# Patient Record
Sex: Female | Born: 1955 | Race: Black or African American | Hispanic: No | Marital: Single | Smoking: Never smoker
Health system: Southern US, Community
[De-identification: ages and names within clinical notes are randomized; demographics above are authoritative.]

## PROBLEM LIST (undated history)

## (undated) DIAGNOSIS — R0789 Other chest pain: Secondary | ICD-10-CM

## (undated) DIAGNOSIS — D649 Anemia, unspecified: Secondary | ICD-10-CM

## (undated) DIAGNOSIS — M1990 Unspecified osteoarthritis, unspecified site: Secondary | ICD-10-CM

## (undated) DIAGNOSIS — J45909 Unspecified asthma, uncomplicated: Secondary | ICD-10-CM

## (undated) DIAGNOSIS — K589 Irritable bowel syndrome without diarrhea: Secondary | ICD-10-CM

## (undated) DIAGNOSIS — K219 Gastro-esophageal reflux disease without esophagitis: Secondary | ICD-10-CM

## (undated) DIAGNOSIS — F419 Anxiety disorder, unspecified: Secondary | ICD-10-CM

## (undated) DIAGNOSIS — E119 Type 2 diabetes mellitus without complications: Secondary | ICD-10-CM

## (undated) DIAGNOSIS — E785 Hyperlipidemia, unspecified: Secondary | ICD-10-CM

## (undated) HISTORY — DX: Gastro-esophageal reflux disease without esophagitis: K21.9

## (undated) HISTORY — DX: Anemia, unspecified: D64.9

## (undated) HISTORY — DX: Other chest pain: R07.89

## (undated) HISTORY — PX: EXPLORATORY LAPAROTOMY: SUR591

## (undated) HISTORY — DX: Anxiety disorder, unspecified: F41.9

## (undated) HISTORY — DX: Unspecified osteoarthritis, unspecified site: M19.90

## (undated) HISTORY — DX: Unspecified asthma, uncomplicated: J45.909

## (undated) HISTORY — PX: PELVIC LAPAROSCOPY: SHX162

## (undated) HISTORY — PX: FINGER SURGERY: SHX640

## (undated) HISTORY — DX: Irritable bowel syndrome, unspecified: K58.9

## (undated) HISTORY — DX: Hyperlipidemia, unspecified: E78.5

---

## 1988-05-29 HISTORY — PX: ROTATOR CUFF REPAIR: SHX139

## 1996-05-29 HISTORY — PX: HYSTEROSCOPY: SHX211

## 1999-09-12 ENCOUNTER — Encounter: Payer: Self-pay | Admitting: Pulmonary Disease

## 1999-09-12 ENCOUNTER — Encounter: Admission: RE | Admit: 1999-09-12 | Discharge: 1999-09-12 | Payer: Self-pay | Admitting: Pulmonary Disease

## 1999-09-23 ENCOUNTER — Ambulatory Visit (HOSPITAL_BASED_OUTPATIENT_CLINIC_OR_DEPARTMENT_OTHER): Admission: RE | Admit: 1999-09-23 | Discharge: 1999-09-23 | Payer: Self-pay | Admitting: Orthopedic Surgery

## 2000-08-13 ENCOUNTER — Other Ambulatory Visit: Admission: RE | Admit: 2000-08-13 | Discharge: 2000-08-13 | Payer: Self-pay | Admitting: *Deleted

## 2000-09-13 ENCOUNTER — Encounter: Admission: RE | Admit: 2000-09-13 | Discharge: 2000-09-13 | Payer: Self-pay | Admitting: Pulmonary Disease

## 2000-09-13 ENCOUNTER — Encounter: Payer: Self-pay | Admitting: Pulmonary Disease

## 2001-10-23 ENCOUNTER — Encounter: Admission: RE | Admit: 2001-10-23 | Discharge: 2001-10-23 | Payer: Self-pay | Admitting: Pulmonary Disease

## 2001-10-23 ENCOUNTER — Encounter: Payer: Self-pay | Admitting: Pulmonary Disease

## 2004-08-15 ENCOUNTER — Ambulatory Visit: Payer: Self-pay | Admitting: Pulmonary Disease

## 2005-05-11 ENCOUNTER — Ambulatory Visit: Payer: Self-pay | Admitting: Pulmonary Disease

## 2005-07-27 ENCOUNTER — Ambulatory Visit: Payer: Self-pay | Admitting: Pulmonary Disease

## 2005-08-07 ENCOUNTER — Ambulatory Visit: Payer: Self-pay | Admitting: Cardiology

## 2005-10-05 ENCOUNTER — Encounter: Admission: RE | Admit: 2005-10-05 | Discharge: 2005-10-05 | Payer: Self-pay | Admitting: Pulmonary Disease

## 2005-12-25 ENCOUNTER — Ambulatory Visit: Payer: Self-pay | Admitting: Pulmonary Disease

## 2006-01-01 ENCOUNTER — Encounter: Admission: RE | Admit: 2006-01-01 | Discharge: 2006-01-01 | Payer: Self-pay | Admitting: Pulmonary Disease

## 2006-01-01 ENCOUNTER — Ambulatory Visit: Payer: Self-pay | Admitting: Pulmonary Disease

## 2006-07-17 ENCOUNTER — Ambulatory Visit: Payer: Self-pay | Admitting: Pulmonary Disease

## 2007-01-07 ENCOUNTER — Ambulatory Visit: Payer: Self-pay | Admitting: Pulmonary Disease

## 2007-01-07 LAB — CONVERTED CEMR LAB
AST: 16 units/L (ref 0–37)
Albumin: 3.9 g/dL (ref 3.5–5.2)
Basophils Absolute: 0 10*3/uL (ref 0.0–0.1)
Basophils Relative: 0.6 % (ref 0.0–1.0)
CO2: 28 meq/L (ref 19–32)
Chloride: 101 meq/L (ref 96–112)
Creatinine, Ser: 0.7 mg/dL (ref 0.4–1.2)
Direct LDL: 94.5 mg/dL
Eosinophils Relative: 1.4 % (ref 0.0–5.0)
Glucose, Bld: 95 mg/dL (ref 70–99)
HCT: 32.8 % — ABNORMAL LOW (ref 36.0–46.0)
Ketones, ur: NEGATIVE mg/dL
Neutrophils Relative %: 52.3 % (ref 43.0–77.0)
Nitrite: NEGATIVE
RBC: 4.09 M/uL (ref 3.87–5.11)
RDW: 13.5 % (ref 11.5–14.6)
Sodium: 136 meq/L (ref 135–145)
Specific Gravity, Urine: 1.025 (ref 1.000–1.03)
Total Bilirubin: 0.7 mg/dL (ref 0.3–1.2)
Total CHOL/HDL Ratio: 2.2
Urobilinogen, UA: 0.2 (ref 0.0–1.0)
VLDL: 7 mg/dL (ref 0–40)
WBC: 3.9 10*3/uL — ABNORMAL LOW (ref 4.5–10.5)
pH: 6 (ref 5.0–8.0)

## 2007-01-17 ENCOUNTER — Ambulatory Visit: Payer: Self-pay | Admitting: Pulmonary Disease

## 2007-01-17 LAB — CONVERTED CEMR LAB: Saturation Ratios: 14.8 % — ABNORMAL LOW (ref 20.0–50.0)

## 2007-02-14 ENCOUNTER — Ambulatory Visit: Payer: Self-pay | Admitting: Pulmonary Disease

## 2007-02-14 LAB — CONVERTED CEMR LAB
Basophils Relative: 0.6 % (ref 0.0–1.0)
CO2: 32 meq/L (ref 19–32)
Creatinine, Ser: 0.8 mg/dL (ref 0.4–1.2)
Eosinophils Relative: 1.1 % (ref 0.0–5.0)
HCT: 36.9 % (ref 36.0–46.0)
Hemoglobin, Urine: NEGATIVE
Hemoglobin: 12.4 g/dL (ref 12.0–15.0)
Leukocytes, UA: NEGATIVE
Monocytes Absolute: 0.3 10*3/uL (ref 0.2–0.7)
Neutrophils Relative %: 61.5 % (ref 43.0–77.0)
Potassium: 4.3 meq/L (ref 3.5–5.1)
RDW: 13.5 % (ref 11.5–14.6)
Sed Rate: 13 mm/hr (ref 0–25)
Sodium: 142 meq/L (ref 135–145)
Specific Gravity, Urine: 1.02 (ref 1.000–1.03)
Total Bilirubin: 0.9 mg/dL (ref 0.3–1.2)
Total Protein, Urine: NEGATIVE mg/dL
Total Protein: 7.2 g/dL (ref 6.0–8.3)
Urobilinogen, UA: 0.2 (ref 0.0–1.0)
pH: 7.5 (ref 5.0–8.0)

## 2007-03-06 ENCOUNTER — Ambulatory Visit: Payer: Self-pay | Admitting: Pulmonary Disease

## 2007-03-06 LAB — CONVERTED CEMR LAB
OCCULT 1: NEGATIVE
OCCULT 5: NEGATIVE

## 2007-06-21 ENCOUNTER — Ambulatory Visit: Payer: Self-pay | Admitting: Pulmonary Disease

## 2007-06-21 DIAGNOSIS — E1169 Type 2 diabetes mellitus with other specified complication: Secondary | ICD-10-CM | POA: Insufficient documentation

## 2007-06-21 DIAGNOSIS — E7800 Pure hypercholesterolemia, unspecified: Secondary | ICD-10-CM | POA: Insufficient documentation

## 2007-06-21 DIAGNOSIS — F411 Generalized anxiety disorder: Secondary | ICD-10-CM | POA: Insufficient documentation

## 2007-06-21 DIAGNOSIS — K589 Irritable bowel syndrome without diarrhea: Secondary | ICD-10-CM | POA: Insufficient documentation

## 2007-06-21 DIAGNOSIS — Z8719 Personal history of other diseases of the digestive system: Secondary | ICD-10-CM | POA: Insufficient documentation

## 2007-06-21 DIAGNOSIS — J309 Allergic rhinitis, unspecified: Secondary | ICD-10-CM | POA: Insufficient documentation

## 2007-06-29 ENCOUNTER — Encounter: Payer: Self-pay | Admitting: Pulmonary Disease

## 2007-12-03 ENCOUNTER — Telehealth (INDEPENDENT_AMBULATORY_CARE_PROVIDER_SITE_OTHER): Payer: Self-pay | Admitting: *Deleted

## 2008-01-02 ENCOUNTER — Ambulatory Visit: Payer: Self-pay | Admitting: Pulmonary Disease

## 2008-01-02 DIAGNOSIS — R0789 Other chest pain: Secondary | ICD-10-CM | POA: Insufficient documentation

## 2008-01-04 DIAGNOSIS — M1990 Unspecified osteoarthritis, unspecified site: Secondary | ICD-10-CM | POA: Insufficient documentation

## 2008-01-04 DIAGNOSIS — K219 Gastro-esophageal reflux disease without esophagitis: Secondary | ICD-10-CM | POA: Insufficient documentation

## 2008-01-20 ENCOUNTER — Telehealth (INDEPENDENT_AMBULATORY_CARE_PROVIDER_SITE_OTHER): Payer: Self-pay | Admitting: *Deleted

## 2008-07-29 ENCOUNTER — Ambulatory Visit: Payer: Self-pay | Admitting: Pulmonary Disease

## 2009-07-19 ENCOUNTER — Ambulatory Visit: Payer: Self-pay | Admitting: Pulmonary Disease

## 2009-07-20 DIAGNOSIS — J209 Acute bronchitis, unspecified: Secondary | ICD-10-CM | POA: Insufficient documentation

## 2009-09-07 ENCOUNTER — Ambulatory Visit: Payer: Self-pay | Admitting: Pulmonary Disease

## 2009-09-09 LAB — CONVERTED CEMR LAB
ALT: 12 units/L (ref 0–35)
Alkaline Phosphatase: 59 units/L (ref 39–117)
Basophils Absolute: 0 10*3/uL (ref 0.0–0.1)
Basophils Relative: 0.7 % (ref 0.0–3.0)
Bilirubin, Direct: 0.1 mg/dL (ref 0.0–0.3)
CO2: 29 meq/L (ref 19–32)
Calcium: 9.6 mg/dL (ref 8.4–10.5)
Chloride: 109 meq/L (ref 96–112)
Creatinine, Ser: 0.7 mg/dL (ref 0.4–1.2)
Direct LDL: 141.2 mg/dL
Eosinophils Absolute: 0.1 10*3/uL (ref 0.0–0.7)
Hemoglobin: 12.5 g/dL (ref 12.0–15.0)
Iron: 56 ug/dL (ref 42–145)
MCHC: 34.2 g/dL (ref 30.0–36.0)
MCV: 81.9 fL (ref 78.0–100.0)
Monocytes Absolute: 0.2 10*3/uL (ref 0.1–1.0)
Neutro Abs: 2.3 10*3/uL (ref 1.4–7.7)
RBC: 4.46 M/uL (ref 3.87–5.11)
RDW: 14.9 % — ABNORMAL HIGH (ref 11.5–14.6)
Saturation Ratios: 16.9 % — ABNORMAL LOW (ref 20.0–50.0)
Sodium: 148 meq/L — ABNORMAL HIGH (ref 135–145)
TSH: 0.81 microintl units/mL (ref 0.35–5.50)
Total Bilirubin: 0.5 mg/dL (ref 0.3–1.2)
Total Protein: 6.8 g/dL (ref 6.0–8.3)
Transferrin: 236.6 mg/dL (ref 212.0–360.0)

## 2009-09-14 LAB — CONVERTED CEMR LAB: Vit D, 25-Hydroxy: 16 ng/mL — ABNORMAL LOW (ref 30–89)

## 2010-06-19 ENCOUNTER — Encounter: Payer: Self-pay | Admitting: Pulmonary Disease

## 2010-06-20 ENCOUNTER — Encounter (INDEPENDENT_AMBULATORY_CARE_PROVIDER_SITE_OTHER): Payer: Self-pay | Admitting: *Deleted

## 2010-06-26 LAB — CONVERTED CEMR LAB
Alkaline Phosphatase: 73 units/L (ref 39–117)
Bilirubin, Direct: 0.1 mg/dL (ref 0.0–0.3)
Calcium: 9.6 mg/dL (ref 8.4–10.5)
Cholesterol: 205 mg/dL (ref 0–200)
Crystals: NEGATIVE
Eosinophils Absolute: 0.1 10*3/uL (ref 0.0–0.7)
Eosinophils Relative: 1.5 % (ref 0.0–5.0)
GFR calc Af Amer: 98 mL/min
GFR calc non Af Amer: 81 mL/min
HDL: 88.1 mg/dL (ref >39.0)
Hemoglobin, Urine: NEGATIVE
Leukocytes, UA: NEGATIVE
MCV: 81.5 fL (ref 78.0–100.0)
Neutrophils Relative %: 50.4 % (ref 43.0–77.0)
Nitrite: NEGATIVE
Platelets: 244 10*3/uL (ref 150–400)
Potassium: 4.2 meq/L (ref 3.5–5.1)
RBC / HPF: NONE SEEN
RDW: 13.9 % (ref 11.5–14.6)
Sodium: 142 meq/L (ref 135–145)
Specific Gravity, Urine: 1.015 (ref 1.000–1.03)
TSH: 0.89 microintl units/mL (ref 0.35–5.50)
Total CHOL/HDL Ratio: 2.3
Total Protein: 7.2 g/dL (ref 6.0–8.3)
Triglycerides: 37 mg/dL (ref 0–149)
Urine Glucose: NEGATIVE mg/dL
Urobilinogen, UA: 0.2 (ref 0.0–1.0)
WBC: 4.8 10*3/uL (ref 4.5–10.5)
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
## 2010-06-30 NOTE — Letter (Signed)
Summary: Pre Visit Letter Revised  [facility]  [address] [place], [place] [address]   Phone: [phone]  Fax: [phone]        06/20/2010 MRN: [id number] Yvonne Marshall [address] [place], [place]  [address]             Procedure Date:  07/19/2011 @ 1:30   Direct colon-Dr. Russella Dar   Welcome to the Gastroenterology Division at [facility].    You are scheduled to see a nurse for your pre-procedure visit on 07/04/2010 at 4:30 on the 3rd floor at [facility], [address]  We ask that you try to arrive at our office 15 minutes prior to your appointment time to allow for check-in.  Please take a minute to review the attached form.  If you answer "Yes" to one or more of the questions on the first page, we ask that you call the person listed at your earliest opportunity.  If you answer "No" to all of the questions, please complete the rest of the form and bring it to your appointment.    Your nurse visit will consist of discussing your medical and surgical history, your immediate family medical history, and your medications.   If you are unable to list all of your medications on the form, please bring the medication bottles to your appointment and we will list them.  We will need to be aware of both prescribed and over the counter drugs.  We will need to know exact dosage information as well.    Please be prepared to read and sign documents such as consent forms, a financial agreement, and acknowledgement forms.  If necessary, and with your consent, a friend or relative is welcome to sit-in on the nurse visit with you.  Please bring your insurance card so that we may make a copy of it.  If your insurance requires a referral to see a specialist, please bring your referral form from your primary care physician.  No co-pay is required for this nurse visit.     If you cannot keep your appointment, please call [phone] to cancel or reschedule prior to your  appointment date.  This allows [place] the opportunity to schedule an appointment for another patient in need of care.    Thank you for choosing [facility] for your medical needs.  We appreciate the opportunity to care for you.  Please visit [place] at our website  to learn more about our practice.  Sincerely, The Gastroenterology Division

## 2010-07-18 ENCOUNTER — Other Ambulatory Visit: Payer: Self-pay | Admitting: Gastroenterology

## 2010-07-20 ENCOUNTER — Other Ambulatory Visit: Payer: Self-pay | Admitting: Gastroenterology

## 2010-07-20 ENCOUNTER — Telehealth: Payer: Self-pay | Admitting: Pulmonary Disease

## 2010-07-21 ENCOUNTER — Other Ambulatory Visit: Payer: Self-pay | Admitting: Pulmonary Disease

## 2010-07-21 DIAGNOSIS — Z1231 Encounter for screening mammogram for malignant neoplasm of breast: Secondary | ICD-10-CM

## 2010-07-26 ENCOUNTER — Encounter (INDEPENDENT_AMBULATORY_CARE_PROVIDER_SITE_OTHER): Payer: Self-pay | Admitting: *Deleted

## 2010-07-26 NOTE — Progress Notes (Signed)
Summary: needs referral for mammogram  Phone Note Call from Patient Call back at Home Phone [phone]   Caller: Patient Call For: Yvonne Marshall Summary of Call: Patient has her physical on 4/16 but she needs a referral for her mammogram before that appointment and she wants to know who Dr Kriste Basque would referr her to. she can be reached at [phone] Initial call taken by: Vedia Coffer,  July 20, 2010 4:55 PM  Follow-up for Phone Call        Called and spoke with pt.  She states that she is overdue for mammogram and needs to have this sched asap.  She also requested 90 day supply of lipitor be sent to [facility] in [place] and this was done. Pls advise, thanks Follow-up by: Vernie Murders,  July 20, 2010 5:03 PM  Additional Follow-up for Phone Call Additional follow up Details #1::        per SN-----recs for [facility]--[address] of [address]  order has been placed for pt ...called and spoke with pt and she is aware Randell Loop CMA  July 21, 2010 8:50 AM     Prescriptions: LIPITOR 20 MG TABS (ATORVASTATIN CALCIUM) take one tablet by mouth once daily  #90 x 0   Entered by:   Vernie Murders   Authorized by:   Michele Mcalpine MD   Signed by:   Vernie Murders on 07/20/2010   Method used:   Electronically to        [facility] [address]* (retail)       [address] [place], [place]  [address]       Ph: [phone]       Fax: [phone]   RxID:   [id number]

## 2010-07-29 ENCOUNTER — Encounter: Payer: Self-pay | Admitting: *Deleted

## 2010-07-29 ENCOUNTER — Ambulatory Visit
Admission: RE | Admit: 2010-07-29 | Discharge: 2010-07-29 | Disposition: A | Discharge status: Home / Self Care | Payer: Commercial Indemnity | Source: Ambulatory Visit | Attending: Pulmonary Disease | Admitting: Pulmonary Disease

## 2010-07-29 DIAGNOSIS — D649 Anemia, unspecified: Secondary | ICD-10-CM | POA: Insufficient documentation

## 2010-07-29 DIAGNOSIS — Z1231 Encounter for screening mammogram for malignant neoplasm of breast: Secondary | ICD-10-CM

## 2010-08-02 ENCOUNTER — Other Ambulatory Visit: Payer: Self-pay | Admitting: Pulmonary Disease

## 2010-08-02 DIAGNOSIS — R928 Other abnormal and inconclusive findings on diagnostic imaging of breast: Secondary | ICD-10-CM

## 2010-08-04 NOTE — Letter (Signed)
Summary: Pre Visit Letter Revised  [facility]  [address] [place], [place] [address]   Phone: [phone]  Fax: [phone]        07/26/2010 MRN: [id number] Yvonne Marshall [address] [place], [place]  [address]             Procedure Date:  08/16/2010 @ 10:30   Direct colon-Dr. Russella Dar   Welcome to the Gastroenterology Division at [facility].    You are scheduled to see a nurse for your pre-procedure visit on 08/02/2010 at 11:00 on the 3rd floor at [facility], [address]  We ask that you try to arrive at our office 15 minutes prior to your appointment time to allow for check-in.  Please take a minute to review the attached form.  If you answer "Yes" to one or more of the questions on the first page, we ask that you call the person listed at your earliest opportunity.  If you answer "No" to all of the questions, please complete the rest of the form and bring it to your appointment.    Your nurse visit will consist of discussing your medical and surgical history, your immediate family medical history, and your medications.   If you are unable to list all of your medications on the form, please bring the medication bottles to your appointment and we will list them.  We will need to be aware of both prescribed and over the counter drugs.  We will need to know exact dosage information as well.    Please be prepared to read and sign documents such as consent forms, a financial agreement, and acknowledgement forms.  If necessary, and with your consent, a friend or relative is welcome to sit-in on the nurse visit with you.  Please bring your insurance card so that we may make a copy of it.  If your insurance requires a referral to see a specialist, please bring your referral form from your primary care physician.  No co-pay is required for this nurse visit.     If you cannot keep your appointment, please call [phone] to cancel or reschedule prior to your  appointment date.  This allows [place] the opportunity to schedule an appointment for another patient in need of care.    Thank you for choosing [facility] for your medical needs.  We appreciate the opportunity to care for you.  Please visit [place] at our website  to learn more about our practice.  Sincerely, The Gastroenterology Division

## 2010-08-05 ENCOUNTER — Ambulatory Visit
Admission: RE | Admit: 2010-08-05 | Discharge: 2010-08-05 | Disposition: A | Discharge status: Home / Self Care | Payer: Commercial Indemnity | Source: Ambulatory Visit | Attending: Pulmonary Disease | Admitting: Pulmonary Disease

## 2010-08-05 DIAGNOSIS — R928 Other abnormal and inconclusive findings on diagnostic imaging of breast: Secondary | ICD-10-CM

## 2010-08-16 ENCOUNTER — Other Ambulatory Visit: Payer: Self-pay | Admitting: Gastroenterology

## 2010-09-09 ENCOUNTER — Encounter: Payer: Self-pay | Admitting: Pulmonary Disease

## 2010-09-12 ENCOUNTER — Encounter: Payer: Self-pay | Admitting: Pulmonary Disease

## 2010-10-11 NOTE — Assessment & Plan Note (Signed)
[facility]                             PULMONARY OFFICE NOTE   NAME:HOLTKarianna, Gusman                        MRN:          [id number]  DATE:02/14/2007                            DOB:          [birth year]    HISTORY OF PRESENT ILLNESS:  The patient is a 55 year old African-  American female patient of Dr. Jodelle Green who has a known history of  allergic rhinitis, asthmatic bronchitis, and hyperlipidemia. She  presents today for a one week history of lower abdominal pain. The  patient complains of some lower abdominal cramping and discomfort over  the last week. She does complain of associated constipation, hard  stools, increased gas and some mild dysuria that began this morning.  Symptoms began shortly after starting iron therapy for iron deficiency  anemia. The patient denies any fever, nausea, vomiting, diarrhea, bloody  stools, recent travel, antibiotic use, chest pain, palpitations, reflux,  or shortness of breath. The patient is menopausal and has not had  periods for greater than six years. The patient recently was noted to  have some mild anemia with a hemoglobin at 11.2. Iron level was at 51  with an iron saturation at 14.8. The patient started on Niferex 150 mg  daily and reports the symptoms started shortly after this. The patient  has not returned her stool card as of yet. She has no family history of  colon cancer. Symptoms seem to be worse after eating and with walking or  activity.   PAST MEDICAL HISTORY:  Reviewed.   CURRENT MEDICATIONS:  Reviewed.   PHYSICAL EXAMINATION:  GENERAL:  The patient is a pleasant female in no  acute distress.  VITAL SIGNS:  She is afebrile. Blood pressure 108/64. Pulse is 58. O2  saturation is 100% on room air. Weight is at 163.  HEENT:  Sclerae nonicteric. Posterior pharynx is clear. Oral mucosa is  pink and moist.  NECK:  Supple with cervical adenopathy. No JVD.  LUNGS:  Clear to auscultation bilaterally.  CARDIAC:  S1, S2 with no murmurs, rubs, or gallops.  ABDOMEN:  Soft and nontender. No palpable hepatosplenomegaly. No  guarding or rebounding noted. The patient does have some generalized  tenderness that is greater on the left upper quadrant. No bruits or  masses appreciated.  BACK:  Negative CVA tenderness.  EXTREMITIES:  Warm without calf tenderness, cyanosis, clubbing, or  edema.  RECTAL:  Revealed a normal sphincter tone. No notable stool in the vault  and negative occult stool.   DATA:  White blood cell count 4700, hemoglobin 12.4, potassium 4.3,  blood sugar 101, BUN and creatinine 8 and 0.8 respectively. Liver  function tests were normal. Sedimentation rate was normal at 13 and  urinalysis is negative.   IMPRESSION AND PLAN:  Lower abdominal pain and cramping questionable  etiology. The patient may have a component of mild gastritis secondary  to iron therapy versus a component of irritable bowel syndrome with  associated constipation. I have recommended that the patient hold her  iron therapy at this time. She is to begin Protonix 40 mg daily.  A bland  diet and advance was recommended as tolerated. She may use Levsin 0.125  mg with a number of 30 tablets given with one refill to be used as  needed for abdominal cramping. She is advised to add in Metamucil or  Citrucel to her daily regimen. She may also use Senokot stool softener.  She also has been recommended to use Gas-X. She has also been  recommended to return her stool cards as soon as possible. The patient  will return back here in 1 to 2 weeks for follow up. If the symptoms do  not improve or worsen, the patient is to contact us for sooner follow up  or is to seek emergency room attention.      Rubye Oaks, NP  Electronically Signed      Lonzo Cloud. Kriste Basque, MD  Electronically Signed   TP/MedQ  DD: 02/14/2007  DT: 02/15/2007  Job #: [id number]
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
## 2011-07-10 ENCOUNTER — Other Ambulatory Visit: Payer: Self-pay | Admitting: Pulmonary Disease

## 2011-07-10 DIAGNOSIS — R920 Mammographic microcalcification found on diagnostic imaging of breast: Secondary | ICD-10-CM

## 2011-07-31 ENCOUNTER — Telehealth: Payer: Self-pay | Admitting: Pulmonary Disease

## 2011-08-01 ENCOUNTER — Ambulatory Visit
Admission: RE | Admit: 2011-08-01 | Discharge: 2011-08-01 | Disposition: A | Discharge status: Home / Self Care | Payer: BC Managed Care – PPO | Source: Ambulatory Visit | Attending: Pulmonary Disease | Admitting: Pulmonary Disease

## 2011-08-01 ENCOUNTER — Ambulatory Visit: Payer: Commercial Indemnity | Admitting: Pulmonary Disease

## 2011-08-01 DIAGNOSIS — R920 Mammographic microcalcification found on diagnostic imaging of breast: Secondary | ICD-10-CM

## 2011-08-01 MED ORDER — ATORVASTATIN CALCIUM 20 MG PO TABS: 20.0000 mg | ORAL_TABLET | Freq: Every day | ORAL | Status: DC

## 2011-08-01 NOTE — Telephone Encounter (Signed)
PT informed that refill was sent to pharmacy for Lipitor.  Pt will call office to schedule CPX with Dr Kriste Basque.

## 2011-08-01 NOTE — Telephone Encounter (Signed)
Pt last seen 2011- needs appt for refills. LMTCB.

## 2011-11-14 ENCOUNTER — Ambulatory Visit: Payer: BC Managed Care – PPO | Admitting: Pulmonary Disease

## 2011-12-15 ENCOUNTER — Ambulatory Visit: Payer: BC Managed Care – PPO | Admitting: Pulmonary Disease

## 2012-08-21 ENCOUNTER — Ambulatory Visit: Payer: BC Managed Care – PPO | Admitting: Pulmonary Disease

## 2012-08-26 ENCOUNTER — Other Ambulatory Visit: Payer: Self-pay

## 2012-08-26 DIAGNOSIS — Z1231 Encounter for screening mammogram for malignant neoplasm of breast: Secondary | ICD-10-CM

## 2012-09-02 ENCOUNTER — Other Ambulatory Visit (INDEPENDENT_AMBULATORY_CARE_PROVIDER_SITE_OTHER): Payer: 59

## 2012-09-02 ENCOUNTER — Ambulatory Visit (INDEPENDENT_AMBULATORY_CARE_PROVIDER_SITE_OTHER)
Admission: RE | Admit: 2012-09-02 | Discharge: 2012-09-02 | Disposition: A | Discharge status: Home / Self Care | Payer: 59 | Source: Ambulatory Visit | Attending: Pulmonary Disease | Admitting: Pulmonary Disease

## 2012-09-02 ENCOUNTER — Ambulatory Visit (INDEPENDENT_AMBULATORY_CARE_PROVIDER_SITE_OTHER): Payer: 59 | Admitting: Pulmonary Disease

## 2012-09-02 ENCOUNTER — Ambulatory Visit
Admission: RE | Admit: 2012-09-02 | Discharge: 2012-09-02 | Disposition: A | Discharge status: Home / Self Care | Payer: 59 | Source: Ambulatory Visit

## 2012-09-02 ENCOUNTER — Encounter: Payer: Self-pay | Admitting: Pulmonary Disease

## 2012-09-02 VITALS — BP 110/70 | HR 55 | Temp 98.5°F | Ht 66.0 in | Wt 142.6 lb

## 2012-09-02 DIAGNOSIS — Z1231 Encounter for screening mammogram for malignant neoplasm of breast: Secondary | ICD-10-CM

## 2012-09-02 DIAGNOSIS — J309 Allergic rhinitis, unspecified: Secondary | ICD-10-CM

## 2012-09-02 DIAGNOSIS — M1990 Unspecified osteoarthritis, unspecified site: Secondary | ICD-10-CM

## 2012-09-02 DIAGNOSIS — Z0000 Encounter for general adult medical examination without abnormal findings: Secondary | ICD-10-CM

## 2012-09-02 DIAGNOSIS — K219 Gastro-esophageal reflux disease without esophagitis: Secondary | ICD-10-CM

## 2012-09-02 DIAGNOSIS — R0789 Other chest pain: Secondary | ICD-10-CM

## 2012-09-02 DIAGNOSIS — E785 Hyperlipidemia, unspecified: Secondary | ICD-10-CM

## 2012-09-02 DIAGNOSIS — K589 Irritable bowel syndrome without diarrhea: Secondary | ICD-10-CM

## 2012-09-02 DIAGNOSIS — F411 Generalized anxiety disorder: Secondary | ICD-10-CM

## 2012-09-02 DIAGNOSIS — D649 Anemia, unspecified: Secondary | ICD-10-CM

## 2012-09-02 DIAGNOSIS — J45909 Unspecified asthma, uncomplicated: Secondary | ICD-10-CM

## 2012-09-02 LAB — BASIC METABOLIC PANEL
BUN: 12 mg/dL (ref 6–23)
Calcium: 9.4 mg/dL (ref 8.4–10.5)
GFR: 115.78 mL/min (ref >60.00)
Potassium: 4.1 mEq/L (ref 3.5–5.1)

## 2012-09-02 LAB — HEPATIC FUNCTION PANEL
ALT: 10 U/L (ref 0–35)
AST: 17 U/L (ref 0–37)
Bilirubin, Direct: 0.1 mg/dL (ref 0.0–0.3)
Total Protein: 7.1 g/dL (ref 6.0–8.3)

## 2012-09-02 LAB — CBC WITH DIFFERENTIAL/PLATELET
Basophils Absolute: 0 10*3/uL (ref 0.0–0.1)
Eosinophils Relative: 1.5 % (ref 0.0–5.0)
HCT: 39 % (ref 36.0–46.0)
Lymphocytes Relative: 24.3 % (ref 12.0–46.0)
Lymphs Abs: 1.1 10*3/uL (ref 0.7–4.0)
Monocytes Relative: 5.1 % (ref 3.0–12.0)
Neutrophils Relative %: 68.7 % (ref 43.0–77.0)
Platelets: 210 10*3/uL (ref 150.0–400.0)
WBC: 4.4 10*3/uL — ABNORMAL LOW (ref 4.5–10.5)

## 2012-09-02 LAB — LIPID PANEL
HDL: 114.1 mg/dL (ref >39.00)
Triglycerides: 37 mg/dL (ref 0.0–149.0)

## 2012-09-02 LAB — LDL CHOLESTEROL, DIRECT: Direct LDL: 134.8 mg/dL

## 2012-09-02 LAB — TSH: TSH: 0.97 u[IU]/mL (ref 0.35–5.50)

## 2012-09-02 MED ORDER — FLUTICASONE PROPIONATE 50 MCG/ACT NA SUSP: 2.0000 | Freq: Every day | NASAL | Status: DC

## 2012-09-02 MED ORDER — FLUTICASONE-SALMETEROL 250-50 MCG/DOSE IN AEPB: 1.0000 | INHALATION_SPRAY | Freq: Two times a day (BID) | RESPIRATORY_TRACT | Status: DC

## 2012-09-02 MED ORDER — FEXOFENADINE HCL 180 MG PO TABS: 180.0000 mg | ORAL_TABLET | Freq: Every day | ORAL | Status: DC

## 2012-09-02 MED ORDER — OMEPRAZOLE 20 MG PO CPDR: 20.0000 mg | DELAYED_RELEASE_CAPSULE | Freq: Every day | ORAL | Status: DC

## 2012-09-02 NOTE — Patient Instructions (Addendum)
Today we updated your med list in our EPIC system...    Continue your current medications the same...    We refilled your meds per request...  Today we did your follow up CXR, EKG, & FASTING blood work...    We will contact you w/ the results when available...   Keep up the good work w/ your diet & exercise program...  Call for any questions...  Let's plan a follow up visit in 13yr, sooner if needed for problems.Marland KitchenMarland Kitchen
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
## 2012-09-03 ENCOUNTER — Encounter: Payer: Self-pay | Admitting: Gastroenterology

## 2012-09-03 ENCOUNTER — Other Ambulatory Visit: Payer: Self-pay | Admitting: Pulmonary Disease

## 2012-09-03 LAB — VITAMIN D 25 HYDROXY (VIT D DEFICIENCY, FRACTURES): Vit D, 25-Hydroxy: 10 ng/mL — ABNORMAL LOW (ref 30–89)

## 2012-09-03 MED ORDER — ATORVASTATIN CALCIUM 10 MG PO TABS: 10.0000 mg | ORAL_TABLET | Freq: Every day | ORAL | Status: DC

## 2012-09-03 MED ORDER — VITAMIN D (ERGOCALCIFEROL) 1.25 MG (50000 UNIT) PO CAPS: 50000.0000 [IU] | ORAL_CAPSULE | ORAL | Status: DC

## 2012-10-11 ENCOUNTER — Telehealth: Payer: Self-pay | Admitting: *Deleted

## 2012-10-11 ENCOUNTER — Encounter: Payer: Self-pay | Admitting: Gastroenterology

## 2012-10-11 NOTE — Telephone Encounter (Signed)
Spoke with Amy, letter sent

## 2012-10-11 NOTE — Telephone Encounter (Signed)
Pt scheduled with Dr. Rhea Belton and was pt of Dr Ardell Isaacs.  RSC colon for 12/16/12 at 11:30am on a Monday/ Pt. Request.  I [facility] her previsit for 12/02/12 at 11:00am.  Message of new appts. Left on pt home phone / pt. Request.  Unable to leave voice message mailbox was full.

## 2012-10-25 ENCOUNTER — Encounter: Payer: 59 | Admitting: Gastroenterology

## 2012-11-04 ENCOUNTER — Encounter: Payer: 59 | Admitting: Internal Medicine

## 2012-12-02 ENCOUNTER — Ambulatory Visit (AMBULATORY_SURGERY_CENTER): Payer: 59 | Admitting: *Deleted

## 2012-12-02 VITALS — Ht 66.0 in | Wt 144.2 lb

## 2012-12-02 DIAGNOSIS — Z1211 Encounter for screening for malignant neoplasm of colon: Secondary | ICD-10-CM

## 2012-12-02 MED ORDER — MOVIPREP 100 G PO SOLR: 1.0000 | Freq: Once | ORAL | Status: DC

## 2012-12-02 NOTE — Progress Notes (Signed)
Denies allergies to eggs or soy products. Denies complications with sedation or anesthesia. 

## 2012-12-05 ENCOUNTER — Encounter: Payer: Self-pay | Admitting: Gastroenterology

## 2012-12-16 ENCOUNTER — Encounter: Payer: Self-pay | Admitting: Gastroenterology

## 2012-12-16 ENCOUNTER — Ambulatory Visit (AMBULATORY_SURGERY_CENTER): Payer: 59 | Admitting: Gastroenterology

## 2012-12-16 VITALS — BP 125/66 | HR 49 | Temp 97.6°F | Resp 21 | Ht 66.0 in | Wt 144.0 lb

## 2012-12-16 DIAGNOSIS — D126 Benign neoplasm of colon, unspecified: Secondary | ICD-10-CM

## 2012-12-16 DIAGNOSIS — Z1211 Encounter for screening for malignant neoplasm of colon: Secondary | ICD-10-CM

## 2012-12-16 MED ORDER — SODIUM CHLORIDE 0.9 % IV SOLN: 500.0000 mL | INTRAVENOUS | Status: DC

## 2012-12-16 NOTE — Progress Notes (Signed)
Called to room to assist during endoscopic procedure.  Patient ID and intended procedure confirmed with present staff. Received instructions for my participation in the procedure from the performing physician.  

## 2012-12-16 NOTE — Op Note (Signed)
[facility] [address] [place], [address]   COLONOSCOPY PROCEDURE REPORT PATIENT: Yvonne Marshall, Yvonne Marshall  MR#: [id number] BIRTHDATE: May 22, 1956 , 56  yrs. old GENDER: Female ENDOSCOPIST: Meryl Dare, MD, [facility] PROCEDURE DATE:  12/16/2012 PROCEDURE:   Colonoscopy with biopsy First Screening Colonoscopy - Avg.  risk and is 50 yrs.  old or older Yes.  Prior Negative Screening - Now for repeat screening. N/A  History of Adenoma - Now for follow-up colonoscopy & has been > or = to 3 yrs.  N/A  Polyps Removed Today? Yes. ASA CLASS:   Class II INDICATIONS:average risk screening. MEDICATIONS: MAC sedation, administered by CRNA and propofol (Diprivan) 350mg  IV DESCRIPTION OF PROCEDURE:   After the risks benefits and alternatives of the procedure were thoroughly explained, informed consent was obtained.  A digital rectal exam revealed no abnormalities of the rectum.   The [id number]  endoscope was introduced through the anus and advanced to the cecum, which was identified by both the appendix and ileocecal valve. No adverse events experienced.   The quality of the prep was excellent, using MoviPrep  The instrument was then slowly withdrawn as the colon was fully examined.  COLON FINDINGS: A sessile polyp measuring 3 mm in size was found at the cecum.  A polypectomy was performed with cold forceps.  The resection was complete and the polyp tissue was completely retrieved.   A sessile polyp measuring 3 mm in size was found in the sigmoid colon.  A polypectomy was performed with cold forceps. The resection was complete and the polyp tissue was completely retrieved.   Mild diverticulosis was noted in the sigmoid colon. The colon was otherwise normal.  There was no diverticulosis, inflammation, polyps or cancers unless previously stated. Retroflexed views revealed moderate internal hemorrhoids. The time to cecum=3 minutes 27 seconds.  Withdrawal time=12 minutes  20 seconds.  The scope was withdrawn and the procedure completed. COMPLICATIONS: There were no complications.  ENDOSCOPIC IMPRESSION: 1.   Sessile polyp measuring 3 mm at the cecum; polypectomy performed with cold forceps 2.   Sessile polyp measuring 3 mm in the sigmoid colon; polypectomy performed with cold forceps 3.   Mild diverticulosis was noted in the sigmoid colon 4.   Moderate internal hemorrhoids  RECOMMENDATIONS: 1.  Await pathology results 2.  Repeat colonoscopy in 5 years if polyp(s) adenomatous; otherwise 10 years 3.  High fiber diet with liberal fluid intake.  eSigned:  Meryl Dare, MD, [facility] 12/16/2012 11:49 AM

## 2012-12-16 NOTE — Progress Notes (Signed)
Patient did not experience any of the following events: a burn prior to discharge; a fall within the facility; wrong site/side/patient/procedure/implant event; or a hospital transfer or hospital admission upon discharge from the facility. (G8907) Patient did not have preoperative order for IV antibiotic SSI prophylaxis. (G8918)  

## 2012-12-16 NOTE — Patient Instructions (Addendum)

## 2012-12-17 ENCOUNTER — Telehealth: Payer: Self-pay | Admitting: *Deleted

## 2012-12-17 NOTE — Telephone Encounter (Signed)
  Follow up Call-  Call back number 12/16/2012  Post procedure Call Back phone  # [phone]  Permission to leave phone message Yes     Patient questions:  Do you have a fever, pain , or abdominal swelling? no Pain Score  0 *  Have you tolerated food without any problems? yes  Have you been able to return to your normal activities? yes  Do you have any questions about your discharge instructions: Diet   no Medications  no Follow up visit  no  Do you have questions or concerns about your Care? no  Actions: * If pain score is 4 or above: No action needed, pain <4.

## 2012-12-20 ENCOUNTER — Encounter: Payer: Self-pay | Admitting: Gastroenterology

## 2012-12-31 ENCOUNTER — Telehealth: Payer: Self-pay | Admitting: Pulmonary Disease

## 2012-12-31 ENCOUNTER — Ambulatory Visit: Payer: 59 | Admitting: Adult Health

## 2012-12-31 NOTE — Telephone Encounter (Signed)
Lmtcbx1.Jersee Winiarski, CMA  

## 2013-01-01 MED ORDER — MECLIZINE HCL 25 MG PO TABS: 25.0000 mg | ORAL_TABLET | Freq: Three times a day (TID) | ORAL | Status: DC | PRN

## 2013-01-01 NOTE — Telephone Encounter (Signed)
Per SN---   1.  Drink plenty of fluids 2.  Try antivert 25 mg  1 po every 4 hours prn dizziness 3.  rov to recheck if not responding to the medication.

## 2013-01-01 NOTE — Telephone Encounter (Signed)
Spoke with patient Patient aware of recs as listed below Aware Rx has been sent in and nothing further needed at this time

## 2013-01-01 NOTE — Telephone Encounter (Signed)
Pt returned call & asked to be reached at [phone].  Antionette Fairy

## 2013-01-01 NOTE — Telephone Encounter (Signed)
Spoke with pt.  C/o feeling dizzy/spinning when she gets up to start walking.  This has happened 3-4 times over the past 2 wks after having colonoscopy done.  She also reports HA; no blurred vision or change in vision.  She does get up slowly but still experiences this feeling at times.  It is off an on.  Requesting futher recs.  SN, pls advise.  Thank you.  Last OV with SN: 09/02/12  nkda - verified with pt

## 2013-01-01 NOTE — Telephone Encounter (Signed)
Pt returned call. Kathleen W Perdue  

## 2013-01-01 NOTE — Telephone Encounter (Signed)
LMTCB x1 for pt.  

## 2013-02-26 ENCOUNTER — Telehealth: Payer: Self-pay | Admitting: Pulmonary Disease

## 2013-02-26 ENCOUNTER — Telehealth: Payer: Self-pay | Admitting: Internal Medicine

## 2013-02-26 MED ORDER — TRAZODONE HCL 150 MG PO TABS: 150.0000 mg | ORAL_TABLET | Freq: Every day | ORAL | Status: DC

## 2013-02-26 NOTE — Telephone Encounter (Signed)
Per SN---  desyrel  150mg    1 po qhs  #30  And refill x 5    thanks

## 2013-02-26 NOTE — Telephone Encounter (Signed)
I spoke with pt. She stated she has a lot of anxiety and not sleeping very well. She has a lot of stress in her life-related to work. Pt has been to talk to her therapists and this has been going on x 5 months. She was advised to call her PCP to see about anti depressant and sleep aid to help. She was giving some names like lexapro and klonopin. She wants to know if she needs to SN to get this called in or if we can over the phone. Also stated her therapists stated if SN  Needed to speak with her then she would be happy to. Please advise SN thanks Last OV 08/2012 for CPX  No Known Allergies

## 2013-02-26 NOTE — Telephone Encounter (Signed)
Spoke with pt.  She called earlier and rx for desyrel was called in.  Pt concerned that this will not start working soon enough.  Pt states she has a situation at work that is creating a lot of anxiety, and she doesn't see it getting any better.  She doesn't feel she is doing well with her coping skills and feels she is about to have a nervous breakdown.  Pt does see a therapist, Vernie Murders, and believes she does help pt with.  She is wondering if SN has any further recs for her or any further guidance.  States SN can call Gunnar Fusi if needed. Paula's cell # [phone] Paula's office # [phone]  Dr. Kriste Basque, pls advise.

## 2013-02-26 NOTE — Telephone Encounter (Signed)
lmomtcb on pt's named VM 

## 2013-02-26 NOTE — Telephone Encounter (Signed)
I spoke with pt. She is aware of SN recs. Rx has been sent and nothing further needed

## 2013-02-26 NOTE — Telephone Encounter (Signed)
I spoke with the pt and advised. She states she will try the medication that SN recommended. She took info for Dr. Donell Beers as well. She states she will call back if medication does not help. Carron Curie, CMA

## 2013-02-26 NOTE — Telephone Encounter (Signed)
Error

## 2013-02-26 NOTE — Telephone Encounter (Signed)
Per SN---  Did she want a different medication?  Counseling is a great idea---she should be leaning on her counselor for advise.    Please give the pts Dr. Caprice Renshaw number to call and schedule an appt.  thanks

## 2013-03-06 ENCOUNTER — Telehealth: Payer: Self-pay | Admitting: Pulmonary Disease

## 2013-03-06 NOTE — Telephone Encounter (Addendum)
Pt last seen in April  Needs ov TP has plenty of openings tomorrow  LMTCB for the pt

## 2013-03-06 NOTE — Telephone Encounter (Signed)
Pt called back & set an appt for 10/14 @ 4:30 PM w/ TP.  Pt refused an appt tomorrow stating she was trying to avoid missing work.  Pt really not happy about setting an appt & states she will be coming from [place].    Antionette Fairy

## 2013-03-06 NOTE — Telephone Encounter (Signed)
LMTCB

## 2013-03-07 NOTE — Telephone Encounter (Signed)
Called # provided - went directly to names VM.  [email or web address] Called pt's home # [phone] - [email or web address]

## 2013-03-10 NOTE — Telephone Encounter (Signed)
Pt has been scheduled for 10/14/014 with TP at 4:30pm; nothing more needed. Will sign off on message.

## 2013-03-11 ENCOUNTER — Telehealth: Payer: Self-pay | Admitting: Pulmonary Disease

## 2013-03-11 ENCOUNTER — Ambulatory Visit: Payer: 59 | Admitting: Adult Health

## 2013-03-11 NOTE — Telephone Encounter (Signed)
LMTCB

## 2013-03-12 NOTE — Telephone Encounter (Signed)
[email or web address] on home and work number. Carron Curie, CMA

## 2013-03-14 NOTE — Telephone Encounter (Signed)
[email or web address]. Victoria Euceda, CMA  

## 2013-03-17 NOTE — Telephone Encounter (Signed)
Will close message as we have atc pt x 4 per triage protocol

## 2013-03-18 ENCOUNTER — Ambulatory Visit: Payer: 59 | Admitting: Adult Health

## 2014-01-30 ENCOUNTER — Other Ambulatory Visit: Payer: Self-pay | Admitting: Pulmonary Disease

## 2014-02-11 ENCOUNTER — Telehealth: Payer: Self-pay | Admitting: Pulmonary Disease

## 2014-02-11 NOTE — Telephone Encounter (Signed)
Per Leigh- ok to set up for appt with SN and refill until then  [facility] for the pt

## 2014-02-12 NOTE — Telephone Encounter (Signed)
Pt returned call [phone]

## 2014-02-12 NOTE — Telephone Encounter (Signed)
Pt returned call [phone]

## 2014-02-12 NOTE — Telephone Encounter (Signed)
LMTCB

## 2014-02-12 NOTE — Telephone Encounter (Signed)
lmomtcb x1 

## 2014-02-13 MED ORDER — ATORVASTATIN CALCIUM 10 MG PO TABS: 10.0000 mg | ORAL_TABLET | Freq: Every day | ORAL | Status: DC

## 2014-02-13 NOTE — Telephone Encounter (Signed)
Appt has been scheduled with SN for 9.23.15 Lipitor 10mg  refilled to [facility], #90 as this was quantity last filled.  No refills given at this time, with note to pharmacy that pt must keep upcoming appt in order to receive future refills.  Will sign off.

## 2014-02-13 NOTE — Telephone Encounter (Signed)
Pt returned call [phone]

## 2014-02-13 NOTE — Telephone Encounter (Signed)
Called and still unable to reach the pt  I left a detailed msg explaining that we will be happy to refill her med but she needs to schedule an appt with SN  Then we can refill med to last until then  I advised that when she calls [place] back to make appt with schedulers and then we will refill med

## 2014-02-18 ENCOUNTER — Encounter: Payer: Self-pay | Admitting: Pulmonary Disease

## 2014-02-18 ENCOUNTER — Ambulatory Visit (INDEPENDENT_AMBULATORY_CARE_PROVIDER_SITE_OTHER): Payer: 59 | Admitting: Pulmonary Disease

## 2014-02-18 VITALS — BP 114/60 | HR 68 | Temp 97.9°F | Ht 66.0 in | Wt 139.6 lb

## 2014-02-18 DIAGNOSIS — K589 Irritable bowel syndrome without diarrhea: Secondary | ICD-10-CM

## 2014-02-18 DIAGNOSIS — K219 Gastro-esophageal reflux disease without esophagitis: Secondary | ICD-10-CM

## 2014-02-18 DIAGNOSIS — J309 Allergic rhinitis, unspecified: Secondary | ICD-10-CM

## 2014-02-18 DIAGNOSIS — Z8719 Personal history of other diseases of the digestive system: Secondary | ICD-10-CM

## 2014-02-18 DIAGNOSIS — F411 Generalized anxiety disorder: Secondary | ICD-10-CM

## 2014-02-18 DIAGNOSIS — E785 Hyperlipidemia, unspecified: Secondary | ICD-10-CM

## 2014-02-18 DIAGNOSIS — J45909 Unspecified asthma, uncomplicated: Secondary | ICD-10-CM

## 2014-02-18 DIAGNOSIS — M1990 Unspecified osteoarthritis, unspecified site: Secondary | ICD-10-CM

## 2014-02-18 DIAGNOSIS — Z0000 Encounter for general adult medical examination without abnormal findings: Secondary | ICD-10-CM | POA: Insufficient documentation

## 2014-02-18 DIAGNOSIS — D649 Anemia, unspecified: Secondary | ICD-10-CM

## 2014-02-18 MED ORDER — FLUTICASONE PROPIONATE 50 MCG/ACT NA SUSP: 2.0000 | Freq: Every day | NASAL | Status: DC

## 2014-02-18 MED ORDER — VITAMIN D (ERGOCALCIFEROL) 1.25 MG (50000 UNIT) PO CAPS: 50000.0000 [IU] | ORAL_CAPSULE | ORAL | Status: DC

## 2014-02-18 MED ORDER — FLUTICASONE-SALMETEROL 250-50 MCG/DOSE IN AEPB: 1.0000 | INHALATION_SPRAY | Freq: Two times a day (BID) | RESPIRATORY_TRACT | Status: DC

## 2014-02-18 MED ORDER — FEXOFENADINE HCL 180 MG PO TABS: 180.0000 mg | ORAL_TABLET | Freq: Every day | ORAL | Status: DC

## 2014-02-18 MED ORDER — OMEPRAZOLE 20 MG PO CPDR: 20.0000 mg | DELAYED_RELEASE_CAPSULE | Freq: Every day | ORAL | Status: DC

## 2014-02-18 MED ORDER — ATORVASTATIN CALCIUM 10 MG PO TABS: 10.0000 mg | ORAL_TABLET | Freq: Every day | ORAL | Status: DC

## 2014-02-18 NOTE — Patient Instructions (Signed)
Today we updated your med list in our EPIC system...    Continue your current medications the same...  We refilled all your meds as we discussed...  During the allergy season- use the Advair, Flonase, & Allegra more regularly...  Please return to our lab one morning soon for your FASTING blood work...    We will contact you w/ the results when available...   Call for any questions...  Let's plan a follow up visit in 23yr, sooner if needed for problems.Marland KitchenMarland Kitchen
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
## 2014-03-30 ENCOUNTER — Other Ambulatory Visit (INDEPENDENT_AMBULATORY_CARE_PROVIDER_SITE_OTHER): Payer: 59

## 2014-03-30 DIAGNOSIS — Z0000 Encounter for general adult medical examination without abnormal findings: Secondary | ICD-10-CM

## 2014-03-30 LAB — CBC WITH DIFFERENTIAL/PLATELET
Basophils Absolute: 0 10*3/uL (ref 0.0–0.1)
Basophils Relative: 0.5 % (ref 0.0–3.0)
EOS ABS: 0.1 10*3/uL (ref 0.0–0.7)
Eosinophils Relative: 1.4 % (ref 0.0–5.0)
HCT: 39.2 % (ref 36.0–46.0)
Hemoglobin: 12.7 g/dL (ref 12.0–15.0)
LYMPHS PCT: 30.8 % (ref 12.0–46.0)
Lymphs Abs: 1.5 10*3/uL (ref 0.7–4.0)
MCHC: 32.5 g/dL (ref 30.0–36.0)
MCV: 83.2 fl (ref 78.0–100.0)
MONO ABS: 0.3 10*3/uL (ref 0.1–1.0)
Monocytes Relative: 5.6 % (ref 3.0–12.0)
NEUTROS ABS: 2.9 10*3/uL (ref 1.4–7.7)
NEUTROS PCT: 61.7 % (ref 43.0–77.0)
Platelets: 226 10*3/uL (ref 150.0–400.0)
RBC: 4.71 Mil/uL (ref 3.87–5.11)
RDW: 14.9 % (ref 11.5–15.5)
WBC: 4.8 10*3/uL (ref 4.0–10.5)

## 2014-03-30 LAB — LIPID PANEL
CHOL/HDL RATIO: 2
Cholesterol: 204 mg/dL — ABNORMAL HIGH (ref 0–200)
HDL: 95.4 mg/dL (ref >39.00)
LDL Cholesterol: 102 mg/dL — ABNORMAL HIGH (ref 0–99)
NonHDL: 108.6
Triglycerides: 32 mg/dL (ref 0.0–149.0)
VLDL: 6.4 mg/dL (ref 0.0–40.0)

## 2014-03-30 LAB — BASIC METABOLIC PANEL
BUN: 9 mg/dL (ref 6–23)
CHLORIDE: 107 meq/L (ref 96–112)
CO2: 30 meq/L (ref 19–32)
Calcium: 9.2 mg/dL (ref 8.4–10.5)
Creatinine, Ser: 0.7 mg/dL (ref 0.4–1.2)
GFR: 109.51 mL/min (ref >60.00)
Glucose, Bld: 96 mg/dL (ref 70–99)
POTASSIUM: 4.2 meq/L (ref 3.5–5.1)
SODIUM: 140 meq/L (ref 135–145)

## 2014-03-30 LAB — HEPATIC FUNCTION PANEL
ALK PHOS: 57 U/L (ref 39–117)
ALT: 10 U/L (ref 0–35)
AST: 21 U/L (ref 0–37)
Albumin: 3.5 g/dL (ref 3.5–5.2)
Bilirubin, Direct: 0.1 mg/dL (ref 0.0–0.3)
TOTAL PROTEIN: 6.7 g/dL (ref 6.0–8.3)
Total Bilirubin: 0.6 mg/dL (ref 0.2–1.2)

## 2014-03-30 LAB — TSH: TSH: 0.8 u[IU]/mL (ref 0.35–4.50)

## 2014-03-30 LAB — VITAMIN D 25 HYDROXY (VIT D DEFICIENCY, FRACTURES): VITD: 30.75 ng/mL (ref 30.00–100.00)

## 2014-04-01 ENCOUNTER — Other Ambulatory Visit: Payer: Self-pay | Admitting: Pulmonary Disease

## 2014-04-01 MED ORDER — VITAMIN D 50 MCG (2000 UT) PO TABS: 2000.0000 [IU] | ORAL_TABLET | Freq: Every day | ORAL | Status: AC

## 2014-04-01 MED ORDER — ATORVASTATIN CALCIUM 10 MG PO TABS: 10.0000 mg | ORAL_TABLET | Freq: Every day | ORAL | Status: DC

## 2014-06-25 ENCOUNTER — Telehealth: Payer: Self-pay | Admitting: Pulmonary Disease

## 2014-06-25 MED ORDER — VITAMIN D (ERGOCALCIFEROL) 1.25 MG (50000 UNIT) PO CAPS: 50000.0000 [IU] | ORAL_CAPSULE | ORAL | Status: DC

## 2014-06-25 MED ORDER — ATORVASTATIN CALCIUM 10 MG PO TABS: 10.0000 mg | ORAL_TABLET | Freq: Every day | ORAL | Status: DC

## 2014-06-25 NOTE — Telephone Encounter (Signed)
Called pt and is aware RX's sent in. Nothing further needed

## 2015-02-09 ENCOUNTER — Telehealth: Payer: Self-pay | Admitting: Pulmonary Disease

## 2015-02-09 DIAGNOSIS — M79605 Pain in left leg: Secondary | ICD-10-CM

## 2015-02-09 DIAGNOSIS — M79674 Pain in right toe(s): Secondary | ICD-10-CM

## 2015-02-09 NOTE — Telephone Encounter (Signed)
Called pt. She reports she has been having sharp pain in left leg and her middle toe is numb on right foot. She is requesting referral to ortho. Please advise SN thanks

## 2015-02-09 NOTE — Telephone Encounter (Signed)
Per SN, Ok to make urgent referral for pt to go to ortho

## 2015-02-09 NOTE — Telephone Encounter (Signed)
Order entered for Ortho referral. Patient notified. Nothing further needed.

## 2015-02-22 ENCOUNTER — Ambulatory Visit: Payer: 59 | Admitting: Pulmonary Disease

## 2015-02-23 ENCOUNTER — Ambulatory Visit: Payer: Self-pay | Admitting: Pulmonary Disease

## 2015-03-10 ENCOUNTER — Ambulatory Visit: Payer: Self-pay | Admitting: Pulmonary Disease

## 2015-03-11 ENCOUNTER — Ambulatory Visit: Payer: Self-pay | Admitting: Pulmonary Disease

## 2015-03-25 ENCOUNTER — Ambulatory Visit: Payer: Self-pay | Admitting: Pulmonary Disease

## 2015-04-21 ENCOUNTER — Ambulatory Visit: Payer: Self-pay | Admitting: Pulmonary Disease

## 2016-06-26 ENCOUNTER — Other Ambulatory Visit: Payer: Self-pay | Admitting: Pulmonary Disease

## 2016-06-26 DIAGNOSIS — Z1231 Encounter for screening mammogram for malignant neoplasm of breast: Secondary | ICD-10-CM

## 2016-06-29 ENCOUNTER — Other Ambulatory Visit (INDEPENDENT_AMBULATORY_CARE_PROVIDER_SITE_OTHER): Payer: 59

## 2016-06-29 ENCOUNTER — Encounter: Payer: Self-pay | Admitting: Pulmonary Disease

## 2016-06-29 ENCOUNTER — Ambulatory Visit (INDEPENDENT_AMBULATORY_CARE_PROVIDER_SITE_OTHER)
Admission: RE | Admit: 2016-06-29 | Discharge: 2016-06-29 | Disposition: A | Discharge status: Home / Self Care | Payer: 59 | Source: Ambulatory Visit | Attending: Pulmonary Disease | Admitting: Pulmonary Disease

## 2016-06-29 ENCOUNTER — Ambulatory Visit (INDEPENDENT_AMBULATORY_CARE_PROVIDER_SITE_OTHER): Payer: 59 | Admitting: Pulmonary Disease

## 2016-06-29 VITALS — BP 110/62 | HR 62 | Temp 97.6°F | Ht 66.0 in | Wt 153.5 lb

## 2016-06-29 DIAGNOSIS — M8949 Other hypertrophic osteoarthropathy, multiple sites: Secondary | ICD-10-CM

## 2016-06-29 DIAGNOSIS — K219 Gastro-esophageal reflux disease without esophagitis: Secondary | ICD-10-CM | POA: Diagnosis not present

## 2016-06-29 DIAGNOSIS — J301 Allergic rhinitis due to pollen: Secondary | ICD-10-CM

## 2016-06-29 DIAGNOSIS — E7800 Pure hypercholesterolemia, unspecified: Secondary | ICD-10-CM

## 2016-06-29 DIAGNOSIS — Z0000 Encounter for general adult medical examination without abnormal findings: Secondary | ICD-10-CM

## 2016-06-29 DIAGNOSIS — M150 Primary generalized (osteo)arthritis: Secondary | ICD-10-CM

## 2016-06-29 DIAGNOSIS — M159 Polyosteoarthritis, unspecified: Secondary | ICD-10-CM

## 2016-06-29 LAB — COMPREHENSIVE METABOLIC PANEL
ALBUMIN: 4.6 g/dL (ref 3.5–5.2)
ALK PHOS: 69 U/L (ref 39–117)
ALT: 63 U/L — AB (ref 0–35)
AST: 18 U/L (ref 0–37)
BILIRUBIN TOTAL: 0.7 mg/dL (ref 0.2–1.2)
BUN: 14 mg/dL (ref 6–23)
CO2: 29 mEq/L (ref 19–32)
Calcium: 9.9 mg/dL (ref 8.4–10.5)
Chloride: 105 mEq/L (ref 96–112)
Creatinine, Ser: 0.75 mg/dL (ref 0.40–1.20)
GFR: 101.98 mL/min (ref >60.00)
Glucose, Bld: 87 mg/dL (ref 70–99)
POTASSIUM: 4 meq/L (ref 3.5–5.1)
Sodium: 141 mEq/L (ref 135–145)
TOTAL PROTEIN: 7.4 g/dL (ref 6.0–8.3)

## 2016-06-29 LAB — CBC WITH DIFFERENTIAL/PLATELET
BASOS ABS: 0 10*3/uL (ref 0.0–0.1)
Basophils Relative: 0.6 % (ref 0.0–3.0)
EOS ABS: 0.1 10*3/uL (ref 0.0–0.7)
Eosinophils Relative: 1.4 % (ref 0.0–5.0)
HCT: 39.9 % (ref 36.0–46.0)
Hemoglobin: 13.1 g/dL (ref 12.0–15.0)
LYMPHS ABS: 1.9 10*3/uL (ref 0.7–4.0)
Lymphocytes Relative: 39.4 % (ref 12.0–46.0)
MCHC: 32.9 g/dL (ref 30.0–36.0)
MCV: 81.2 fl (ref 78.0–100.0)
MONOS PCT: 6.7 % (ref 3.0–12.0)
Monocytes Absolute: 0.3 10*3/uL (ref 0.1–1.0)
NEUTROS ABS: 2.5 10*3/uL (ref 1.4–7.7)
NEUTROS PCT: 51.9 % (ref 43.0–77.0)
PLATELETS: 239 10*3/uL (ref 150.0–400.0)
RBC: 4.91 Mil/uL (ref 3.87–5.11)
RDW: 14.2 % (ref 11.5–15.5)
WBC: 4.9 10*3/uL (ref 4.0–10.5)

## 2016-06-29 LAB — LIPID PANEL
CHOLESTEROL: 282 mg/dL — AB (ref 0–200)
HDL: 100.5 mg/dL (ref >39.00)
LDL Cholesterol: 170 mg/dL — ABNORMAL HIGH (ref 0–99)
NONHDL: 181.7
Total CHOL/HDL Ratio: 3
Triglycerides: 61 mg/dL (ref 0.0–149.0)
VLDL: 12.2 mg/dL (ref 0.0–40.0)

## 2016-06-29 LAB — VITAMIN D 25 HYDROXY (VIT D DEFICIENCY, FRACTURES): VITD: 16.99 ng/mL — AB (ref 30.00–100.00)

## 2016-06-29 LAB — TSH: TSH: 0.94 u[IU]/mL (ref 0.35–4.50)

## 2016-06-29 LAB — IBC PANEL
Iron: 75 ug/dL (ref 42–145)
SATURATION RATIOS: 19.1 % — AB (ref 20.0–50.0)
TRANSFERRIN: 281 mg/dL (ref 212.0–360.0)

## 2016-06-29 LAB — FERRITIN: FERRITIN: 112.3 ng/mL (ref 10.0–291.0)

## 2016-06-29 MED ORDER — ATORVASTATIN CALCIUM 10 MG PO TABS: 10.0000 mg | ORAL_TABLET | Freq: Every day | ORAL | 4 refills | Status: DC
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
## 2016-06-29 NOTE — Patient Instructions (Signed)
Today we updated your med list in our EPIC system...    We are continuing the LIPITOR (Atorvastatin) 10mg  tabs one tab daily...     We decided to STOP the prev Advair & Prilosec for now...  For your allergies & sinus symptoms>    Take the OTC [id number] one tab daily during your allergy season...    Use the OTC FLONASE (Fluticasone) 2 sprays in each nostril at bedtime...    Use an OTC SALINE NASAL Mist like Ocean. AYR/ etc & spray your nostrils every 1-2H as needed...  Today we did a follow up CXR, EKG, & FASTING blood work...    We will contact you w/ the results when available...   REMEMBER- DIET & EXERCISE...  Call for any questions...  Let's plan a follow up visit in 65yr, sooner if needed for problems.Marland KitchenMarland Kitchen

## 2016-06-30 ENCOUNTER — Other Ambulatory Visit: Payer: Self-pay | Admitting: Pulmonary Disease

## 2016-06-30 ENCOUNTER — Telehealth: Payer: Self-pay | Admitting: Pulmonary Disease

## 2016-06-30 ENCOUNTER — Ambulatory Visit
Admission: RE | Admit: 2016-06-30 | Discharge: 2016-06-30 | Disposition: A | Discharge status: Home / Self Care | Payer: 59 | Source: Ambulatory Visit | Attending: Pulmonary Disease | Admitting: Pulmonary Disease

## 2016-06-30 DIAGNOSIS — Z1231 Encounter for screening mammogram for malignant neoplasm of breast: Secondary | ICD-10-CM

## 2016-06-30 DIAGNOSIS — N644 Mastodynia: Secondary | ICD-10-CM

## 2016-06-30 NOTE — Telephone Encounter (Signed)
Will forward to SN so he can call the pt back.

## 2016-06-30 NOTE — Telephone Encounter (Signed)
I called pt to give her results of CXR, EKG, LABS... CXR & EKG are OK- NAD... Labs showed sl elev of ALT (SGPT) at 63- it has been WNL on all her prev yrs... NOTE that FLP is abn as well w/ TChol=282, LDL=170, but TG=61 & HDL=101; she indicates that she has not taken any statin med for ~50months;  She admits to drinking beer only ~6pk/wk or less... We discussed the elev ALT & rec repeat liver panel, check Hepatitis panel & HepC Ab test, plus sched Abd Ultrasound to check GB & liver.Marland KitchenMarland Kitchen

## 2016-07-03 ENCOUNTER — Other Ambulatory Visit: Payer: Self-pay | Admitting: Pulmonary Disease

## 2016-07-03 DIAGNOSIS — R748 Abnormal levels of other serum enzymes: Secondary | ICD-10-CM

## 2016-07-05 ENCOUNTER — Other Ambulatory Visit: Payer: Self-pay | Admitting: Pulmonary Disease

## 2016-07-05 ENCOUNTER — Telehealth: Payer: Self-pay | Admitting: Pulmonary Disease

## 2016-07-05 DIAGNOSIS — R748 Abnormal levels of other serum enzymes: Secondary | ICD-10-CM

## 2016-07-05 NOTE — Telephone Encounter (Signed)
Will check with SN tomorrow to see about these forms that were sent over last week.

## 2016-07-06 NOTE — Telephone Encounter (Signed)
lmomtcb x1 

## 2016-07-07 MED ORDER — LEVOFLOXACIN 500 MG PO TABS: 500.0000 mg | ORAL_TABLET | Freq: Every day | ORAL | 0 refills | Status: DC

## 2016-07-07 NOTE — Telephone Encounter (Signed)
SN please advise on forms and antibiotic.  Thanks.

## 2016-07-07 NOTE — Telephone Encounter (Signed)
Forms for the breast US has been faxed back.  SN pt stated that you spoke to her about an abx that was going to be sent in for her for the yellow/bloody mucous from her cough.  Please advise. Thanks  No Known Allergies

## 2016-07-07 NOTE — Telephone Encounter (Signed)
Patient calling back -pr

## 2016-07-07 NOTE — Telephone Encounter (Signed)
Patient called back regarding forms - she also said that SN told her that he was going to send antibiotic to her pharmacy due to yellow/bloody mucous she had been coughing up. Patient uses [facility] on [address] - She can be reached at [phone] -pr

## 2016-07-07 NOTE — Telephone Encounter (Signed)
Spoke with pt. Made her aware that we are still waiting on SN's response.  SN - please advise. Thanks.

## 2016-07-07 NOTE — Telephone Encounter (Signed)
Called and spoke with pt and she is aware of these results---reviewed these lab results again with her and she is aware of these results.  Pt is aware of the levaquin that has been sent to the pharmacy.

## 2016-07-11 ENCOUNTER — Ambulatory Visit (HOSPITAL_COMMUNITY)
Admission: RE | Admit: 2016-07-11 | Discharge: 2016-07-11 | Disposition: A | Discharge status: Home / Self Care | Payer: 59 | Source: Ambulatory Visit | Attending: Pulmonary Disease | Admitting: Pulmonary Disease

## 2016-07-11 ENCOUNTER — Other Ambulatory Visit (INDEPENDENT_AMBULATORY_CARE_PROVIDER_SITE_OTHER): Payer: 59

## 2016-07-11 DIAGNOSIS — R748 Abnormal levels of other serum enzymes: Secondary | ICD-10-CM

## 2016-07-11 DIAGNOSIS — R7989 Other specified abnormal findings of blood chemistry: Secondary | ICD-10-CM | POA: Diagnosis not present

## 2016-07-11 LAB — LIPID PANEL
CHOLESTEROL: 251 mg/dL — AB (ref 0–200)
HDL: 92.6 mg/dL (ref >39.00)
LDL Cholesterol: 145 mg/dL — ABNORMAL HIGH (ref 0–99)
NonHDL: 158.09
Total CHOL/HDL Ratio: 3
Triglycerides: 64 mg/dL (ref 0.0–149.0)
VLDL: 12.8 mg/dL (ref 0.0–40.0)

## 2016-07-11 LAB — HEPATITIS PANEL, ACUTE
HCV AB: NEGATIVE
HEP A IGM: NONREACTIVE
HEP B C IGM: NONREACTIVE
Hepatitis B Surface Ag: NEGATIVE

## 2016-07-11 LAB — HEPATITIS C ANTIBODY: HCV AB: NEGATIVE

## 2016-07-13 ENCOUNTER — Other Ambulatory Visit: Payer: Self-pay | Admitting: Pulmonary Disease

## 2016-07-13 DIAGNOSIS — E7800 Pure hypercholesterolemia, unspecified: Secondary | ICD-10-CM

## 2016-07-13 NOTE — Progress Notes (Signed)
Spoke with patient and informed her of results and recommendations. Pt was instructed to go to the lab on 3/15 to have her LFT lab drawn. Pt did no have any additional questions. Nothing further is needed.

## 2016-07-13 NOTE — Progress Notes (Signed)
Spoke with patient and informed her of results. Pt did not have any questions. Nothing further is needed.

## 2016-07-13 NOTE — Progress Notes (Signed)
Left message for patient to call office for medical results.

## 2016-07-13 NOTE — Progress Notes (Signed)
Left voice message for patient to call back for medical results.

## 2016-07-19 ENCOUNTER — Ambulatory Visit
Admission: RE | Admit: 2016-07-19 | Discharge: 2016-07-19 | Disposition: A | Discharge status: Home / Self Care | Payer: 59 | Source: Ambulatory Visit | Attending: Pulmonary Disease | Admitting: Pulmonary Disease

## 2016-07-19 DIAGNOSIS — N644 Mastodynia: Secondary | ICD-10-CM

## 2016-07-19 DIAGNOSIS — R922 Inconclusive mammogram: Secondary | ICD-10-CM | POA: Diagnosis not present

## 2016-08-17 ENCOUNTER — Other Ambulatory Visit (INDEPENDENT_AMBULATORY_CARE_PROVIDER_SITE_OTHER): Payer: 59

## 2016-08-17 DIAGNOSIS — E7800 Pure hypercholesterolemia, unspecified: Secondary | ICD-10-CM

## 2016-08-17 LAB — HEPATIC FUNCTION PANEL
ALT: 12 U/L (ref 0–35)
AST: 16 U/L (ref 0–37)
Albumin: 4.5 g/dL (ref 3.5–5.2)
Alkaline Phosphatase: 60 U/L (ref 39–117)
Bilirubin, Direct: 0.1 mg/dL (ref 0.0–0.3)
Total Bilirubin: 0.5 mg/dL (ref 0.2–1.2)
Total Protein: 7.1 g/dL (ref 6.0–8.3)

## 2017-01-04 DIAGNOSIS — H40012 Open angle with borderline findings, low risk, left eye: Secondary | ICD-10-CM | POA: Diagnosis not present

## 2017-01-18 DIAGNOSIS — H401132 Primary open-angle glaucoma, bilateral, moderate stage: Secondary | ICD-10-CM | POA: Diagnosis not present

## 2017-01-31 ENCOUNTER — Other Ambulatory Visit: Payer: Self-pay | Admitting: Pulmonary Disease

## 2017-01-31 ENCOUNTER — Other Ambulatory Visit (INDEPENDENT_AMBULATORY_CARE_PROVIDER_SITE_OTHER): Payer: 59

## 2017-01-31 DIAGNOSIS — E7800 Pure hypercholesterolemia, unspecified: Secondary | ICD-10-CM

## 2017-01-31 DIAGNOSIS — M25511 Pain in right shoulder: Secondary | ICD-10-CM | POA: Diagnosis not present

## 2017-01-31 LAB — HEPATIC FUNCTION PANEL
ALT: 10 U/L (ref 0–35)
AST: 16 U/L (ref 0–37)
Albumin: 4.1 g/dL (ref 3.5–5.2)
Alkaline Phosphatase: 67 U/L (ref 39–117)
BILIRUBIN TOTAL: 0.5 mg/dL (ref 0.2–1.2)
Bilirubin, Direct: 0.1 mg/dL (ref 0.0–0.3)
Total Protein: 6.5 g/dL (ref 6.0–8.3)

## 2017-01-31 LAB — LIPID PANEL
Cholesterol: 204 mg/dL — ABNORMAL HIGH (ref 0–200)
HDL: 74.8 mg/dL (ref >39.00)
LDL CALC: 118 mg/dL — AB (ref 0–99)
NONHDL: 128.85
Total CHOL/HDL Ratio: 3
Triglycerides: 53 mg/dL (ref 0.0–149.0)
VLDL: 10.6 mg/dL (ref 0.0–40.0)

## 2017-02-02 ENCOUNTER — Telehealth: Payer: Self-pay | Admitting: Pulmonary Disease

## 2017-02-02 NOTE — Telephone Encounter (Signed)
Please notify patient>  FLP improved on Atorva10 w/ TChol 204, TG 53, HDL 75, LDL 118 and LFTs are wnl ... She may need incr Atorva to 20mg /d but we will keep at 10mg /d for now & stress low chol/ low fat diet.  lmomtcb x 1

## 2017-02-02 NOTE — Telephone Encounter (Signed)
Spoke with patient. She is aware of results. Verbalized understanding. Nothing else needed at time of call.  

## 2017-03-01 DIAGNOSIS — M25511 Pain in right shoulder: Secondary | ICD-10-CM | POA: Diagnosis not present

## 2017-03-01 DIAGNOSIS — M7501 Adhesive capsulitis of right shoulder: Secondary | ICD-10-CM | POA: Diagnosis not present

## 2017-04-25 ENCOUNTER — Telehealth: Payer: Self-pay | Admitting: Pulmonary Disease

## 2017-04-25 DIAGNOSIS — Z1211 Encounter for screening for malignant neoplasm of colon: Secondary | ICD-10-CM

## 2017-04-25 NOTE — Telephone Encounter (Signed)
Looked at pt's last note from Dr. Lenna Gilford who stated that for now we will keep pt on the current dose of Lipitor 10mg  and to stress a low chol/low fat diet. Did not see anything in there from Dr. Lenna Gilford stating to send a referral for pt to have a colonoscopy.  Tried to call pt to get more information from her. Left message for pt to call us back x1.

## 2017-04-25 NOTE — Telephone Encounter (Signed)
Patient returned call, [phone].

## 2017-04-25 NOTE — Telephone Encounter (Signed)
lmtcb x1 for pt. 

## 2017-04-27 MED ORDER — ATORVASTATIN CALCIUM 10 MG PO TABS: 10.0000 mg | ORAL_TABLET | Freq: Every day | ORAL | 1 refills | Status: DC

## 2017-04-27 NOTE — Telephone Encounter (Signed)
Pt returned call. Informed her that per SN he would like to keep her on Lipitor 10mg  and stress a low cholesterol and low fat diet before change to Lipitor 20mg . Pt needs rx [facility] on [address], this has been done. Pt verbalized understanding and also states that SN wanted her referred to GI for a colonoscopy, I do not see this referral in the chart. Pt last seen in 06/2016 by SN and advised to f/u in 1 year.   Dr. Lenna Gilford, please advise if ok to refer for colonoscopy eval. Thanks.

## 2017-04-27 NOTE — Telephone Encounter (Signed)
Per SN note:  Pt should stay on Lipitor 10, low fat, low cholesterol diet, recheck lipids in 06/2017  Needs colonoscopy as well. Order placed in [place].   Nothing further is needed.

## 2017-04-27 NOTE — Telephone Encounter (Signed)
ATC pt, no answer. Left message for pt to call back.  

## 2017-07-02 ENCOUNTER — Ambulatory Visit: Payer: 59 | Admitting: Pulmonary Disease

## 2017-07-10 ENCOUNTER — Encounter: Payer: Self-pay | Admitting: Pulmonary Disease

## 2017-07-10 ENCOUNTER — Ambulatory Visit (INDEPENDENT_AMBULATORY_CARE_PROVIDER_SITE_OTHER): Payer: 59 | Admitting: Pulmonary Disease

## 2017-07-10 VITALS — BP 112/62 | HR 54 | Temp 98.2°F | Ht 66.0 in | Wt 159.4 lb

## 2017-07-10 DIAGNOSIS — J209 Acute bronchitis, unspecified: Secondary | ICD-10-CM | POA: Diagnosis not present

## 2017-07-10 MED ORDER — LEVOFLOXACIN 500 MG PO TABS: 500.0000 mg | ORAL_TABLET | Freq: Every day | ORAL | 0 refills | Status: DC

## 2017-07-10 MED ORDER — METHYLPREDNISOLONE 4 MG PO TBPK: ORAL_TABLET | ORAL | 0 refills | Status: DC

## 2017-07-10 NOTE — Patient Instructions (Signed)
Today we updated your med list in our EPIC system...    Continue your current medications the same...  For your upper resp infection>    I rec LEVAQUIN 500mg  one tab daily x 7d...    And a Prednisone Dosepak- take as directed on the pack...  Rest/  Fluids/  Tylenol as needed... You may also use DELSYM OTC for cough...  Call for any questions...  Let's plan doing your yearly physical in the spring.Marland KitchenMarland Kitchen
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
## 2017-08-29 ENCOUNTER — Other Ambulatory Visit (INDEPENDENT_AMBULATORY_CARE_PROVIDER_SITE_OTHER): Payer: 59

## 2017-08-29 ENCOUNTER — Encounter: Payer: Self-pay | Admitting: Pulmonary Disease

## 2017-08-29 ENCOUNTER — Ambulatory Visit (INDEPENDENT_AMBULATORY_CARE_PROVIDER_SITE_OTHER): Payer: 59 | Admitting: Pulmonary Disease

## 2017-08-29 VITALS — BP 128/62 | HR 73 | Temp 98.0°F | Ht 66.0 in | Wt 156.2 lb

## 2017-08-29 DIAGNOSIS — J301 Allergic rhinitis due to pollen: Secondary | ICD-10-CM

## 2017-08-29 DIAGNOSIS — M159 Polyosteoarthritis, unspecified: Secondary | ICD-10-CM

## 2017-08-29 DIAGNOSIS — E7800 Pure hypercholesterolemia, unspecified: Secondary | ICD-10-CM | POA: Diagnosis not present

## 2017-08-29 DIAGNOSIS — F411 Generalized anxiety disorder: Secondary | ICD-10-CM

## 2017-08-29 DIAGNOSIS — M150 Primary generalized (osteo)arthritis: Secondary | ICD-10-CM | POA: Diagnosis not present

## 2017-08-29 DIAGNOSIS — M8949 Other hypertrophic osteoarthropathy, multiple sites: Secondary | ICD-10-CM

## 2017-08-29 DIAGNOSIS — Z0000 Encounter for general adult medical examination without abnormal findings: Secondary | ICD-10-CM

## 2017-08-29 DIAGNOSIS — K219 Gastro-esophageal reflux disease without esophagitis: Secondary | ICD-10-CM

## 2017-08-29 DIAGNOSIS — K589 Irritable bowel syndrome without diarrhea: Secondary | ICD-10-CM

## 2017-08-29 LAB — COMPREHENSIVE METABOLIC PANEL
ALT: 12 U/L (ref 0–35)
AST: 18 U/L (ref 0–37)
Albumin: 4.1 g/dL (ref 3.5–5.2)
Alkaline Phosphatase: 72 U/L (ref 39–117)
BILIRUBIN TOTAL: 0.5 mg/dL (ref 0.2–1.2)
BUN: 11 mg/dL (ref 6–23)
CO2: 28 meq/L (ref 19–32)
CREATININE: 0.65 mg/dL (ref 0.40–1.20)
Calcium: 9.6 mg/dL (ref 8.4–10.5)
Chloride: 107 mEq/L (ref 96–112)
GFR: 119.81 mL/min (ref >60.00)
GLUCOSE: 106 mg/dL — AB (ref 70–99)
Potassium: 4.1 mEq/L (ref 3.5–5.1)
Sodium: 143 mEq/L (ref 135–145)
Total Protein: 7.1 g/dL (ref 6.0–8.3)

## 2017-08-29 LAB — TSH: TSH: 0.86 u[IU]/mL (ref 0.35–4.50)
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
## 2017-08-29 NOTE — Patient Instructions (Signed)
Today we updated your med list in our EPIC system...    Continue your current medications the same...  Today we did your follow up FASTING blood work...    We will contact you w/ the results when available...   We will also arrange for your annual mammogram at the breast center...  Call for any questions or if we can be of service in any way.Marland KitchenMarland Kitchen

## 2017-09-01 LAB — VITAMIN D 1,25 DIHYDROXY
VITAMIN D 1, 25 (OH) TOTAL: 44 pg/mL (ref 18–72)
VITAMIN D3 1, 25 (OH): 22 pg/mL
Vitamin D2 1, 25 (OH)2: 22 pg/mL

## 2017-09-01 LAB — IRON,TIBC AND FERRITIN PANEL
%SAT: 15 % (calc) (ref 11–50)
Ferritin: 130 ng/mL (ref 10–232)
IRON: 47 ug/dL (ref 45–160)
TIBC: 306 ug/dL (ref 250–450)

## 2017-09-18 ENCOUNTER — Ambulatory Visit
Admission: RE | Admit: 2017-09-18 | Discharge: 2017-09-18 | Disposition: A | Discharge status: Home / Self Care | Payer: 59 | Source: Ambulatory Visit | Attending: Pulmonary Disease | Admitting: Pulmonary Disease

## 2017-09-18 DIAGNOSIS — Z1231 Encounter for screening mammogram for malignant neoplasm of breast: Secondary | ICD-10-CM | POA: Diagnosis not present

## 2017-09-18 DIAGNOSIS — Z0000 Encounter for general adult medical examination without abnormal findings: Secondary | ICD-10-CM

## 2017-12-14 ENCOUNTER — Encounter: Payer: Self-pay | Admitting: Adult Health

## 2017-12-14 ENCOUNTER — Ambulatory Visit (INDEPENDENT_AMBULATORY_CARE_PROVIDER_SITE_OTHER): Payer: 59 | Admitting: Adult Health

## 2017-12-14 DIAGNOSIS — M2550 Pain in unspecified joint: Secondary | ICD-10-CM

## 2017-12-14 NOTE — Assessment & Plan Note (Signed)
Fall on July 11 with pain in left shoulder, arm, lower back , hip and left leg /knee .  Exam is unrevealing with normal ROM and gait. Suspect pain is from MSK inflammation .  Advance activity as tolerated, ice and heat.  Trial of Advil x 5 days and tylenol As needed   If not improving will need ortho referral . Hold on xray today   Plan  Patient Instructions  Alternate ice and heat to areas .  May use Ibuprofen 200mg  2 tabs Twice daily  For 5 days , take with food.  May use Tylenol As needed  Pain .  If not improving will need referral to Ortho.  follow up Dr. Lenna Gilford  As planned and As needed   Please contact office for sooner follow up if symptoms do not improve or worsen or seek emergency care

## 2017-12-14 NOTE — Patient Instructions (Signed)
Alternate ice and heat to areas .  May use Ibuprofen 200mg  2 tabs Twice daily  For 5 days , take with food.  May use Tylenol As needed  Pain .  If not improving will need referral to Ortho.  follow up Dr. Lenna Gilford  As planned and As needed   Please contact office for sooner follow up if symptoms do not improve or worsen or seek emergency care

## 2017-12-14 NOTE — Progress Notes (Signed)
@Patient  ID: Yvonne Marshall, female    DOB: 1955/12/10, 62 y.o.   MRN: [id number]  Chief Complaint  Patient presents with  . Acute Visit    fall     Referring provider: Noralee Space, MD  HPI: 62 year old female followed for primary care by Dr. Lenna Gilford .  Patient has mild asthma, allergic rhinitis, hyperlipidemia, DJD  12/14/2017 Acute OV : Fall  Patient presents for an acute office visit.  She complains that she fell 1 week ago.  She was walking and tripped on some uneven sidewalk pavement.  She fell onto her left side.  She was able to break her fall with her hands.  However she did hit her left knee and leg.  Patient says her wrist and hand hurt initially but that has improved.  But has noticed that she is been having some sharp pains especially along her shoulder arm and left leg.  She denies any head injury loss of consciousness or visual changes.  She denies any extremity weakness. Patient has not taken any medications or use ice or heat.  She is able to bear weight.  She denies any urinary issues.      No Known Allergies   There is no immunization history on file for this patient.  Past Medical History:  Diagnosis Date  . Allergic rhinitis   . Anemia, mild   . Anxiety   . Asthma, mild   . Chest pain, atypical   . Degenerative joint disease   . GERD (gastroesophageal reflux disease)   . Hemorrhoids   . Hyperlipidemia   . IBS (irritable bowel syndrome)     Tobacco History: Social History   Tobacco Use  Smoking Status Never Smoker  Smokeless Tobacco Never Used   Counseling given: Not Answered   Outpatient Medications Prior to Visit  Medication Sig Dispense Refill  . ascorbic Acid (VITAMIN C) 500 MG CPCR Take 500 mg by mouth daily. Take with iron tablet     . atorvastatin (LIPITOR) 10 MG tablet Take 1 tablet (10 mg total) by mouth daily. 90 tablet 1  . Cholecalciferol (VITAMIN D) 2000 UNITS tablet Take 1 tablet (2,000 Units total) by mouth daily. 90 tablet 1    . ferrous sulfate 325 (65 FE) MG tablet Take 325 mg by mouth daily with breakfast.       No facility-administered medications prior to visit.      Review of Systems  Constitutional:   No  weight loss, night sweats,  Fevers, chills, fatigue, or  lassitude.  HEENT:   No headaches,  Difficulty swallowing,  Tooth/dental problems, or  Sore throat,                No sneezing, itching, ear ache, nasal congestion, post nasal drip,   CV:  No chest pain,  Orthopnea, PND, swelling in lower extremities, anasarca, dizziness, palpitations, syncope.   GI  No heartburn, indigestion, abdominal pain, nausea, vomiting, diarrhea, change in bowel habits, loss of appetite, bloody stools.   Resp: No shortness of breath with exertion or at rest.  No excess mucus, no productive cough,  No non-productive cough,  No coughing up of blood.  No change in color of mucus.  No wheezing.  No chest wall deformity  Skin: no rash or lesions.  GU: no dysuria, change in color of urine, no urgency or frequency.  No flank pain, no hematuria   MS:  +arm, shoulder, leg pain + back pain.  Physical Exam  BP 104/66 (BP Location: Right Arm, Cuff Size: Normal)   Pulse 83   Ht 5\' 7"  (1.702 m)   Wt 150 lb 6.4 oz (68.2 kg)   SpO2 99%   BMI 23.56 kg/m   GEN: A/Ox3; pleasant , NAD, well nourished    HEENT:  [place]/AT,  EACs-clear, TMs-wnl, NOSE-clear, THROAT-clear, no lesions, no postnasal drip or exudate noted.   NECK:  Supple w/ fair ROM; no JVD; normal carotid impulses w/o bruits; no thyromegaly or nodules palpated; no lymphadenopathy.    RESP  Clear  P & A; w/o, wheezes/ rales/ or rhonchi. no accessory muscle use, no dullness to percussion  CARD:  RRR, no m/r/g, no peripheral edema, pulses intact, no cyanosis or clubbing.  GI:   Soft & nt; nml bowel sounds; no organomegaly or masses detected. No guarding noted.   Musco: Warm bil, no deformities or joint swelling noted.  MAEW x 4 , nml grips. ROM of leg, knee,  shoulder and wrist are normal . Normal gait.  SLR nml /neg .   Neuro: alert, no focal deficits noted.   Skin: Warm, no lesions or rashes. Small scab along left lateral knee     Lab Results:  CBC    Component Value Date/Time   WBC 4.9 06/29/2016 1345   RBC 4.91 06/29/2016 1345   HGB 13.1 06/29/2016 1345   HCT 39.9 06/29/2016 1345   PLT 239.0 06/29/2016 1345   MCV 81.2 06/29/2016 1345   MCHC 32.9 06/29/2016 1345   RDW 14.2 06/29/2016 1345   LYMPHSABS 1.9 06/29/2016 1345   MONOABS 0.3 06/29/2016 1345   EOSABS 0.1 06/29/2016 1345   BASOSABS 0.0 06/29/2016 1345    BMET    Component Value Date/Time   NA 143 08/29/2017 1022   K 4.1 08/29/2017 1022   CL 107 08/29/2017 1022   CO2 28 08/29/2017 1022   GLUCOSE 106 (H) 08/29/2017 1022   BUN 11 08/29/2017 1022   CREATININE 0.65 08/29/2017 1022   CALCIUM 9.6 08/29/2017 1022   GFRNONAA 113.26 09/07/2009 1237   GFRAA 98 01/02/2008 0000    BNP No results found for: BNP  ProBNP No results found for: PROBNP  Imaging: No results found.   Assessment & Plan:   Joint pain Fall on July 11 with pain in left shoulder, arm, lower back , hip and left leg /knee .  Exam is unrevealing with normal ROM and gait. Suspect pain is from MSK inflammation .  Advance activity as tolerated, ice and heat.  Trial of Advil x 5 days and tylenol As needed   If not improving will need ortho referral . Hold on xray today   Plan  Patient Instructions  Alternate ice and heat to areas .  May use Ibuprofen 200mg  2 tabs Twice daily  For 5 days , take with food.  May use Tylenol As needed  Pain .  If not improving will need referral to Ortho.  follow up Dr. Lenna Gilford  As planned and As needed   Please contact office for sooner follow up if symptoms do not improve or worsen or seek emergency care          Rexene Edison, NP 12/14/2017

## 2018-01-27 ENCOUNTER — Other Ambulatory Visit: Payer: Self-pay | Admitting: Pulmonary Disease

## 2018-03-21 ENCOUNTER — Ambulatory Visit (INDEPENDENT_AMBULATORY_CARE_PROVIDER_SITE_OTHER): Payer: 59 | Admitting: Pulmonary Disease

## 2018-03-21 ENCOUNTER — Encounter: Payer: Self-pay | Admitting: Pulmonary Disease

## 2018-03-21 VITALS — BP 116/62 | HR 74 | Temp 98.4°F | Ht 66.0 in | Wt 156.4 lb

## 2018-03-21 DIAGNOSIS — K589 Irritable bowel syndrome without diarrhea: Secondary | ICD-10-CM

## 2018-03-21 DIAGNOSIS — F411 Generalized anxiety disorder: Secondary | ICD-10-CM

## 2018-03-21 DIAGNOSIS — R202 Paresthesia of skin: Secondary | ICD-10-CM

## 2018-03-21 DIAGNOSIS — M150 Primary generalized (osteo)arthritis: Secondary | ICD-10-CM

## 2018-03-21 DIAGNOSIS — R1013 Epigastric pain: Secondary | ICD-10-CM | POA: Diagnosis not present

## 2018-03-21 DIAGNOSIS — K219 Gastro-esophageal reflux disease without esophagitis: Secondary | ICD-10-CM

## 2018-03-21 DIAGNOSIS — M159 Polyosteoarthritis, unspecified: Secondary | ICD-10-CM

## 2018-03-21 DIAGNOSIS — E7800 Pure hypercholesterolemia, unspecified: Secondary | ICD-10-CM

## 2018-03-21 DIAGNOSIS — J301 Allergic rhinitis due to pollen: Secondary | ICD-10-CM | POA: Diagnosis not present

## 2018-03-21 DIAGNOSIS — M8949 Other hypertrophic osteoarthropathy, multiple sites: Secondary | ICD-10-CM

## 2018-03-21 MED ORDER — PANTOPRAZOLE SODIUM 40 MG PO TBEC: DELAYED_RELEASE_TABLET | ORAL | 6 refills | Status: DC

## 2018-03-21 MED ORDER — FAMOTIDINE 40 MG PO TABS: 40.0000 mg | ORAL_TABLET | Freq: Every day | ORAL | 6 refills | Status: DC

## 2018-03-21 MED ORDER — GABAPENTIN 100 MG PO CAPS: ORAL_CAPSULE | ORAL | 1 refills | Status: DC

## 2018-03-21 NOTE — Patient Instructions (Signed)
Today we updated your med list in our EPIC system...    Continue your current medications the same...  For the epigastric discomfort we decided to place you on some acid suppressors>>    Start the PROTONIX (Pantoprazole) 40mg  taken about 30 min before the evening meal...    I would like you to also take PEPCID 40mg  one tab prior to bedtime w/ sip of water...  For the leg tingling sensation>>    Start the new GABAPENTIN 100mg  caps taking one cap 3 times daily (morn, mid-day, eve) for the 1st wk or so...    Then plan to increase the dose to 2 caps three times daily after ~7-10 days, if the symptoms continue without improvement...  Let's plan a follow up visit to check your response in 1 mo.Marland KitchenMarland Kitchen

## 2018-04-20 ENCOUNTER — Encounter: Payer: Self-pay | Admitting: Pulmonary Disease

## 2018-04-20 NOTE — Progress Notes (Addendum)
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 showed VitD = 31 & rec to get on 2000u OTC supplement daily...  Anxiety> not currently on meds and doing satis     Plan:      Patient's Medications  New Prescriptions   FAMOTIDINE (PEPCID) 40 MG TABLET    Take 1 tablet (40 mg total) by mouth at bedtime.   GABAPENTIN (NEURONTIN) 100 MG CAPSULE    Start with 1 tab three times per day as directed   PANTOPRAZOLE (PROTONIX) 40 MG TABLET    Take 1 30 min. Before evening meal  Previous Medications   ASCORBIC ACID (VITAMIN C) 500 MG CPCR    Take 500 mg by mouth daily. Take with iron tablet    ATORVASTATIN (LIPITOR) 10 MG TABLET    TAKE 1 TABLET BY MOUTH EVERY DAY   CHOLECALCIFEROL (VITAMIN D) 2000 UNITS TABLET    Take 1 tablet (2,000 Units total) by mouth daily.   FERROUS SULFATE 325 (65 FE) MG TABLET    Take 325 mg by mouth daily with breakfast.    Modified Medications   No medications on file  Discontinued Medications   No medications on file

## 2018-04-22 ENCOUNTER — Ambulatory Visit: Payer: 59 | Admitting: Pulmonary Disease

## 2018-05-30 DIAGNOSIS — H40009 Preglaucoma, unspecified, unspecified eye: Secondary | ICD-10-CM | POA: Diagnosis not present

## 2018-08-19 ENCOUNTER — Other Ambulatory Visit: Payer: Self-pay

## 2018-08-19 ENCOUNTER — Encounter: Payer: Self-pay | Admitting: Nurse Practitioner

## 2018-08-19 ENCOUNTER — Ambulatory Visit (INDEPENDENT_AMBULATORY_CARE_PROVIDER_SITE_OTHER): Payer: 59 | Admitting: Nurse Practitioner

## 2018-08-19 ENCOUNTER — Telehealth: Payer: Self-pay | Admitting: Adult Health

## 2018-08-19 DIAGNOSIS — J309 Allergic rhinitis, unspecified: Secondary | ICD-10-CM | POA: Diagnosis not present

## 2018-08-19 MED ORDER — CETIRIZINE HCL 10 MG PO CAPS: 10.0000 mg | ORAL_CAPSULE | Freq: Every day | ORAL | 0 refills | Status: DC

## 2018-08-19 MED ORDER — AZITHROMYCIN 250 MG PO TABS: ORAL_TABLET | ORAL | 0 refills | Status: DC

## 2018-08-19 NOTE — Assessment & Plan Note (Addendum)
Patient has a tele-visit today for sore throat and cough.  She states that her symptoms started 1 week ago.  States that her cough is dry/nonproductive.  She denies any fever.  She states that she has not taken anything to help relieve symptoms.  He does have seasonal allergies but is not taking allergy medications at this time.   Patient Instructions  Will order azithromycin Will order zyrtec to start daily Continue current medications May take delsym as needed for cough  Will place referral for a new PCP since Dr. Lenna Gilford has retired

## 2018-08-19 NOTE — Progress Notes (Signed)
Virtual Visit via Telephone Note  I connected with Yvonne Marshall on 08/19/18 at  2:00 PM EDT by telephone and verified that I am speaking with the correct person using two identifiers.   I discussed the limitations, risks, security and privacy concerns of performing an evaluation and management service by telephone and the availability of in person appointments. I also discussed with the patient that there may be a patient responsible charge related to this service. The patient expressed understanding and agreed to proceed.   History of Present Illness: 63 year old female never smoker with allergic rhinitis, IBS, GERD who was formerly followed by Dr. Lenna Gilford as her PCP.   Patient has a tele-visit today for sore throat and cough.  She states that her symptoms started 1 week ago.  States that her cough is dry/nonproductive.  She denies any fever.  She states that she has not taken anything to help relieve symptoms.  He does have seasonal allergies but is not taking allergy medications at this time.  Denies any sinus congestion pressure pain. Denies f/c/s, n/v/d, hemoptysis, PND, leg swelling.     Observations/Objective:  -CXR 06/29/16 - No active cardiopulmonary disease.  Assessment and Plan: Allergic rhinitis, unspecified seasonality, unspecified trigger - Plan: Ambulatory referral to Internal Medicine   Patient has a tele-visit today for sore throat and cough.  She states that her symptoms started 1 week ago.  States that her cough is dry/nonproductive.  She denies any fever.  She states that she has not taken anything to help relieve symptoms.  He does have seasonal allergies but is not taking allergy medications at this time.   Patient Instructions  Will order azithromycin Will order zyrtec to start daily Continue current medications May take delsym as needed for cough  Will place referral for a new PCP since Dr. Lenna Gilford has retired    Follow Up Instructions: Patient Instructions   Will order azithromycin Will order zyrtec to start daily Continue current medications May take delsym as needed for cough  Will place referral for a new PCP since Dr. Lenna Gilford has retired   Coronavirus (COVID-19) Are you at risk?  Are you at risk for the Coronavirus (COVID-19)?  To be considered HIGH RISK for Coronavirus (COVID-19), you have to meet the following criteria:  . Traveled to [place], [place], [place], [place] or [place]; or in the [place] to [place], [place], [place], or [place]; and have fever, cough, and shortness of breath within the last 2 weeks of travel OR . Been in close contact with a person diagnosed with COVID-19 within the last 2 weeks and have fever, cough, and shortness of breath . IF YOU DO NOT MEET THESE CRITERIA, YOU ARE CONSIDERED LOW RISK FOR COVID-19.  What to do if you are HIGH RISK for COVID-19?  Marland Kitchen If you are having a medical emergency, call 911. . Seek medical care right away. Before you go to a doctor's office, urgent care or emergency department, call ahead and tell them about your recent travel, contact with someone diagnosed with COVID-19, and your symptoms. You should receive instructions from your physician's office regarding next steps of care.  . When you arrive at healthcare provider, tell the healthcare staff immediately you have returned from visiting [place], [place], [place], [place] or [place]; or traveled in the [place] to [place], [place], [place], or [place]; in the last two weeks or you have been in close contact with a person diagnosed with  COVID-19 in the last 2 weeks.   . Tell the health care staff about your symptoms: fever, cough and shortness of breath. . After you have been seen by a medical provider, you will be either: o Tested for (COVID-19) and discharged home on quarantine except to seek medical care if symptoms worsen, and asked to  - Stay home and avoid contact with others until you get your results  (4-5 days)  - Avoid travel on public transportation if possible (such as bus, train, or airplane) or o Sent to the Emergency Department by EMS for evaluation, COVID-19 testing, and possible admission depending on your condition and test results.  What to do if you are LOW RISK for COVID-19?  Reduce your risk of any infection by using the same precautions used for avoiding the common cold or flu:  Marland Kitchen Wash your hands often with soap and warm water for at least 20 seconds.  If soap and water are not readily available, use an alcohol-based hand sanitizer with at least 60% alcohol.  . If coughing or sneezing, cover your mouth and nose by coughing or sneezing into the elbow areas of your shirt or coat, into a tissue or into your sleeve (not your hands). . Avoid shaking hands with others and consider head nods or verbal greetings only. . Avoid touching your eyes, nose, or mouth with unwashed hands.  . Avoid close contact with people who are sick. . Avoid places or events with large numbers of people in one location, like concerts or sporting events. . Carefully consider travel plans you have or are making. . If you are planning any travel outside or inside the [place], visit the CDC's Travelers' Health webpage for the latest health notices. . If you have some symptoms but not all symptoms, continue to monitor at home and seek medical attention if your symptoms worsen. . If you are having a medical emergency, call 911.   [place] / e-Visit: [email or web address]         [facility]: [phone]  [facility]: [phone]                   [facility]: [phone]       I discussed the assessment and treatment plan with the patient. The patient was provided an opportunity to ask questions and all were answered. The patient agreed with the plan and demonstrated an  understanding of the instructions.   The patient was advised to call back or seek an in-person evaluation if the symptoms worsen or if the condition fails to improve as anticipated.  I provided 23 minutes of non-face-to-face time during this encounter.   Fenton Foy, NP

## 2018-08-19 NOTE — Patient Instructions (Signed)
Will order azithromycin Will order zyrtec to start daily Continue current medications May take delsym as needed for cough  Will place referral for a new PCP since Dr. Lenna Gilford has retired   Coronavirus (COVID-19) Are you at risk?  Are you at risk for the Coronavirus (COVID-19)?  To be considered HIGH RISK for Coronavirus (COVID-19), you have to meet the following criteria:  . Traveled to [place], [place], [place], [place] or [place]; or in the [place] to [place], [place], [place], or [place]; and have fever, cough, and shortness of breath within the last 2 weeks of travel OR . Been in close contact with a person diagnosed with COVID-19 within the last 2 weeks and have fever, cough, and shortness of breath . IF YOU DO NOT MEET THESE CRITERIA, YOU ARE CONSIDERED LOW RISK FOR COVID-19.  What to do if you are HIGH RISK for COVID-19?  Marland Kitchen If you are having a medical emergency, call 911. . Seek medical care right away. Before you go to a doctor's office, urgent care or emergency department, call ahead and tell them about your recent travel, contact with someone diagnosed with COVID-19, and your symptoms. You should receive instructions from your physician's office regarding next steps of care.  . When you arrive at healthcare provider, tell the healthcare staff immediately you have returned from visiting [place], [place], [place], [place] or [place]; or traveled in the [place] to [place], [place], [place], or [place]; in the last two weeks or you have been in close contact with a person diagnosed with COVID-19 in the last 2 weeks.   . Tell the health care staff about your symptoms: fever, cough and shortness of breath. . After you have been seen by a medical provider, you will be either: o Tested for (COVID-19) and discharged home on quarantine except to seek medical care if symptoms worsen, and asked to  - Stay home and avoid contact with others until you get your results (4-5  days)  - Avoid travel on public transportation if possible (such as bus, train, or airplane) or o Sent to the Emergency Department by EMS for evaluation, COVID-19 testing, and possible admission depending on your condition and test results.  What to do if you are LOW RISK for COVID-19?  Reduce your risk of any infection by using the same precautions used for avoiding the common cold or flu:  Marland Kitchen Wash your hands often with soap and warm water for at least 20 seconds.  If soap and water are not readily available, use an alcohol-based hand sanitizer with at least 60% alcohol.  . If coughing or sneezing, cover your mouth and nose by coughing or sneezing into the elbow areas of your shirt or coat, into a tissue or into your sleeve (not your hands). . Avoid shaking hands with others and consider head nods or verbal greetings only. . Avoid touching your eyes, nose, or mouth with unwashed hands.  . Avoid close contact with people who are sick. . Avoid places or events with large numbers of people in one location, like concerts or sporting events. . Carefully consider travel plans you have or are making. . If you are planning any travel outside or inside the [place], visit the CDC's Travelers' Health webpage for the latest health notices. . If you have some symptoms but not all symptoms, continue to monitor at home and seek medical attention if your symptoms worsen. . If you are having a  medical emergency, call 911.   [place] / e-Visit: [email or web address]         [facility]: [facility]: [phone]                   [facility]: [phone]

## 2018-08-19 NOTE — Telephone Encounter (Signed)
Primary Pulmonologist: Former SN pt but has seen TP also Last office visit and with whom: 03/21/18 with SN What do we see them for (pulmonary problems): Former SN PCP pt Last OV assessment/plan:  Instructions   Return in about 4 weeks (around 04/18/2018).  Today we updated your med list in our EPIC system...    Continue your current medications the same...  For the epigastric discomfort we decided to place you on some acid suppressors>>    Start the PROTONIX (Pantoprazole) 40mg  taken about 30 min before the evening meal...    I would like you to also take PEPCID 40mg  one tab prior to bedtime w/ sip of water...  For the leg tingling sensation>>    Start the new GABAPENTIN 100mg  caps taking one cap 3 times daily (morn, mid-day, eve) for the 1st wk or so...    Then plan to increase the dose to 2 caps three times daily after ~7-10 days, if the symptoms continue without improvement...  Let's plan a follow up visit to check your response in 1 mo...       Was appointment offered to patient (explain)?  Pt wants to know recs and if something can be prescribed   Reason for call: Called and spoke with pt who stated she has had sore throat and dry cough x1week and also states she has had increased fatigue.   Pt denies any fever but states she has had some tightness in chest.  Pt denies any recent travel but states she has had two people at her job that has been quarantined due to symptoms of fever stated by pt but not that they had the coronavirus.  Pt states she has tried throat losenges to see if it would help with her throat and cough which pt stated it has helped but then it came back.  Pt states today, her cough is not as bad as it was yesterday, 08/18/2018 but states she is still coughing.  Pt wants to know recs and if something can be called in to help with her symptoms.  Beth, please advise recs for pt. Thanks!

## 2018-08-19 NOTE — Telephone Encounter (Signed)
Called and spoke with Patient. Geraldo Pitter, NP, recommendations given.  Understanding stating.  Patient scheduled 08/19/18, at 2pm, televisit. Nothing further at this time.

## 2018-08-19 NOTE — Telephone Encounter (Signed)
Please schedule the patient a tele-visit with one of our providers today

## 2018-08-26 IMAGING — DX DG CHEST 2V
2 series · 2 of 2 positions shown · non-contrast
Comparison: Radiographs September 02, 2012.

CLINICAL DATA: Annual physical exam.

EXAM:
CHEST  2 VIEW

[chest pa]
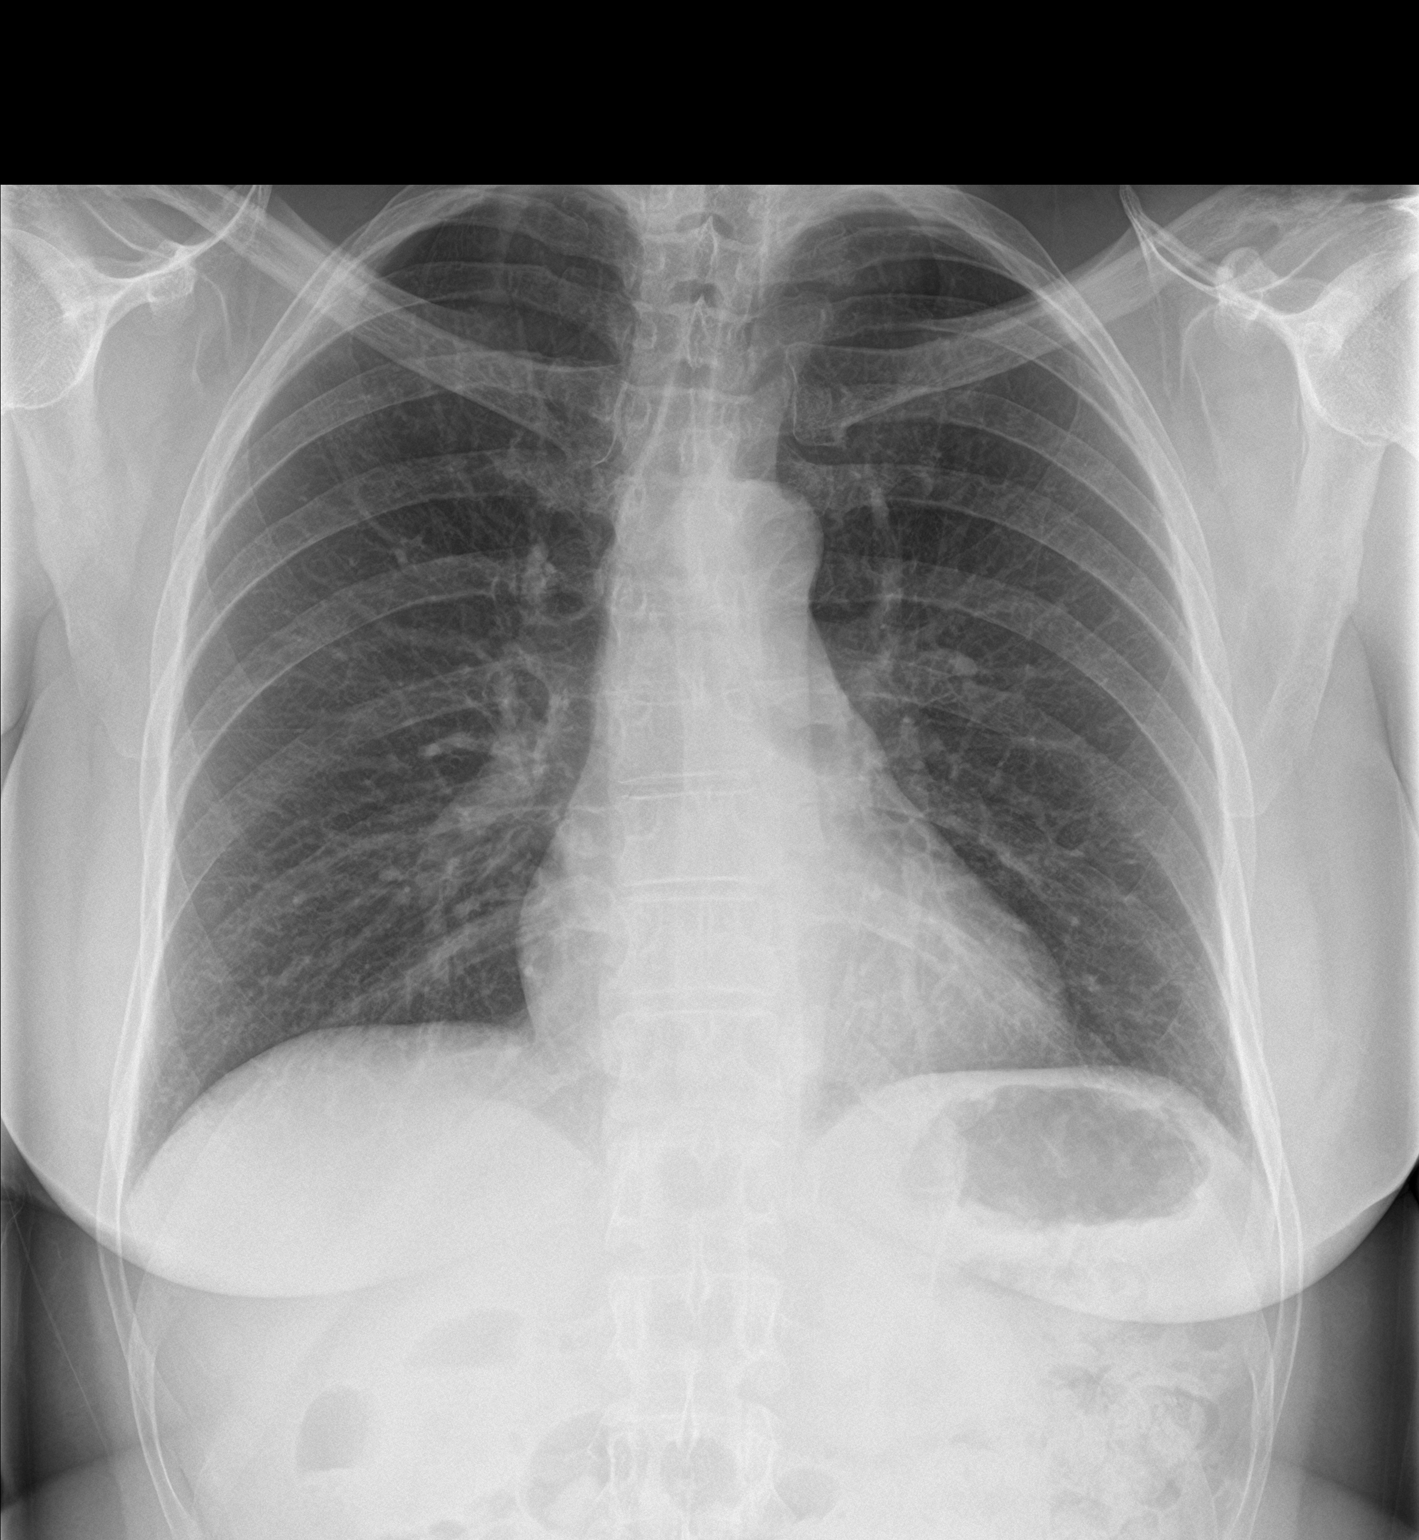

[chest lat]
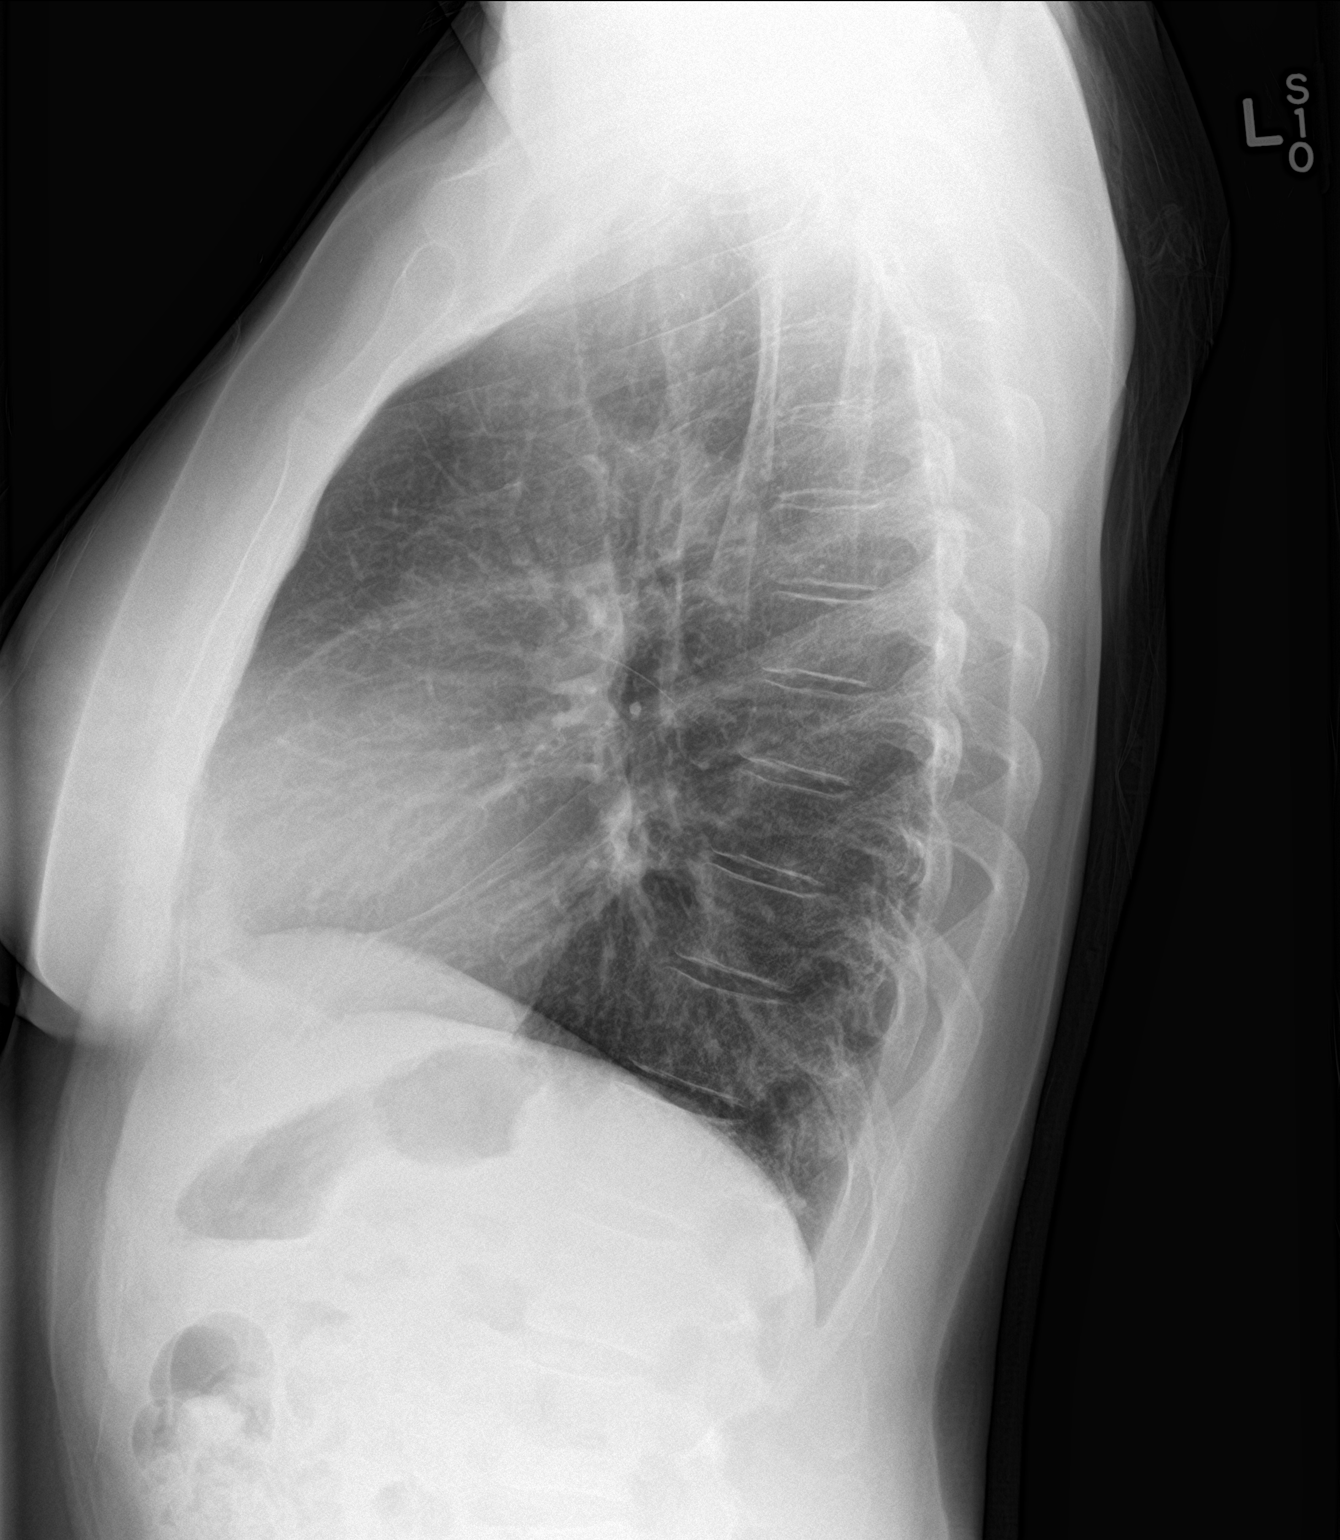

[2 of 2 positions shown; findings below may reference images not displayed]

FINDINGS: The heart size and mediastinal contours are within normal limits.
Both lungs are clear. No pneumothorax or pleural effusion is noted.
The visualized skeletal structures are unremarkable.
IMPRESSION: No active cardiopulmonary disease.

## 2018-09-11 ENCOUNTER — Telehealth: Payer: Self-pay

## 2018-09-11 NOTE — Telephone Encounter (Signed)
Pt is calling to ask for appt for TOC with Dr Jerilee Hoh. She previously saw Dr. Lenna Gilford for PCP. She would like to do this as soon as possible. She was seen recently by pulmonary for cough and sore throat. She was given antibiotics and has improved some. She still has dry cough.   Dr. Jerilee Hoh - Please advise if ok to set pt up for virtual TOC appt. Thanks!

## 2018-09-12 NOTE — Telephone Encounter (Signed)
Ok, does she want to do it today?

## 2018-09-12 NOTE — Telephone Encounter (Signed)
Left message on machine for patient to schedule a [email or web address] appointment

## 2018-09-16 ENCOUNTER — Telehealth: Payer: Self-pay | Admitting: *Deleted

## 2018-09-16 NOTE — Telephone Encounter (Signed)
Left detailed message on machine for patient to see if she wanted to establish care with Dr Jerilee Hoh.  No information given in chart. CRM

## 2018-09-16 NOTE — Telephone Encounter (Signed)
Copied from [place] [id number]. Topic: General - Other >> Sep 13, 2018  2:45 PM Richardo Priest, [place] wrote: Reason for CRM: Patient called in stating she received a call, not sure from who, in regards to a possible new patient appointment. States she would like a call back for this issue at [phone]. >> Sep 13, 2018  3:08 PM Virl Cagey, CMA wrote: Apolonio Schneiders did you call this patient at any point? The chart is completely empty and there is no record of any calls to her and she states the voicemail was very vague in who was calling and why, just had our contact number to return call.

## 2018-09-17 NOTE — Telephone Encounter (Signed)
Please contact pt to schedule TOC.

## 2018-09-17 NOTE — Telephone Encounter (Signed)
Left message on machine returning patient's call to schedule TOC.

## 2018-09-18 NOTE — Telephone Encounter (Signed)
LMVM for the patient to call back to schedule a TOC appointment with Dr. Jerilee Hoh

## 2018-09-24 ENCOUNTER — Ambulatory Visit: Payer: 59 | Admitting: Internal Medicine

## 2018-09-24 ENCOUNTER — Other Ambulatory Visit: Payer: Self-pay

## 2018-09-24 ENCOUNTER — Ambulatory Visit (INDEPENDENT_AMBULATORY_CARE_PROVIDER_SITE_OTHER): Payer: 59 | Admitting: Internal Medicine

## 2018-09-24 DIAGNOSIS — K219 Gastro-esophageal reflux disease without esophagitis: Secondary | ICD-10-CM

## 2018-09-24 DIAGNOSIS — E7800 Pure hypercholesterolemia, unspecified: Secondary | ICD-10-CM

## 2018-09-24 DIAGNOSIS — J309 Allergic rhinitis, unspecified: Secondary | ICD-10-CM

## 2018-09-24 NOTE — Progress Notes (Signed)
Virtual Visit via Video Note  I connected with Yvonne Marshall on 09/24/18 at  3:00 PM EDT by a video enabled telemedicine application and verified that I am speaking with the correct person using two identifiers.  Location patient: home Location provider: work office Persons participating in the virtual visit: patient, provider  I discussed the limitations of evaluation and management by telemedicine and the availability of in person appointments. The patient expressed understanding and agreed to proceed.   HPI: This visit is to establish care, follow up on chronic conditions and to discuss an acute issue.  PMH is significant for:  1. HLD. She freely admits to not being compliant with her lipitor, wants to know if she can stop taking it.  2. GERD. She is supposed to be on a protonix/pepcid combination but she is not taking taking either. Usually not symptomatic.  3. Peripheral neuropathy that doesn't seem to be a big issue for her as she no longer takes her gabapentin.  She does not like taking medications and would prefer to be on none. She works for [employer] in [employer]. Never smoker. Drinks beer occasionally. She is due for her CPE.  She has been having a dry cough for weeks. She suffers from seasonal allergies. No fever or SOB.   ROS: Constitutional: Denies fever, chills, diaphoresis, appetite change and fatigue.  HEENT: Denies photophobia, eye pain, redness, hearing loss, ear pain,  mouth sores, trouble swallowing, neck pain, neck stiffness and tinnitus.   Respiratory: Denies SOB, DOE, cough, chest tightness,  and wheezing.   Cardiovascular: Denies chest pain, palpitations and leg swelling.  Gastrointestinal: Denies nausea, vomiting, abdominal pain, diarrhea, constipation, blood in stool and abdominal distention.  Genitourinary: Denies dysuria, urgency, frequency, hematuria, flank pain and difficulty urinating.  Endocrine: Denies: hot or cold intolerance, sweats, changes in  hair or nails, polyuria, polydipsia. Musculoskeletal: Denies myalgias, back pain, joint swelling, arthralgias and gait problem.  Skin: Denies pallor, rash and wound.  Neurological: Denies dizziness, seizures, syncope, weakness, light-headedness, numbness and headaches.  Hematological: Denies adenopathy. Easy bruising, personal or family bleeding history  Psychiatric/Behavioral: Denies suicidal ideation, mood changes, confusion, nervousness, sleep disturbance and agitation   Past Medical History:  Diagnosis Date  . Allergic rhinitis   . Anemia, mild   . Anxiety   . Asthma, mild   . Chest pain, atypical   . Degenerative joint disease   . GERD (gastroesophageal reflux disease)   . Hemorrhoids   . Hyperlipidemia   . IBS (irritable bowel syndrome)     Past Surgical History:  Procedure Laterality Date  . EXPLORATORY LAPAROTOMY     for adhesions  . HYSTEROSCOPY  1998  . ROTATOR CUFF REPAIR Left 1990    Family History  Problem Relation Age of Onset  . Alcohol abuse Father   . Cancer Unknown        sibling  . Breast cancer Maternal Grandmother        60's  . Colon cancer Neg Hx   . Esophageal cancer Neg Hx   . Rectal cancer Neg Hx   . Stomach cancer Neg Hx     SOCIAL HX:   reports that she has never smoked. She has never used smokeless tobacco. She reports current alcohol use of about 3.0 - 4.0 standard drinks of alcohol per week. She reports that she does not use drugs.   Current Outpatient Medications:  .  ascorbic Acid (VITAMIN C) 500 MG CPCR, Take 500 mg by  mouth daily. Take with iron tablet , Disp: , Rfl:  .  atorvastatin (LIPITOR) 10 MG tablet, TAKE 1 TABLET BY MOUTH EVERY DAY, Disp: 90 tablet, Rfl: 1 .  azithromycin (ZITHROMAX) 250 MG tablet, Take 2 tablets (500 mg) on day 1, then take 1 tablet (250 mg) on days 2-5, Disp: 6 tablet, Rfl: 0 .  Cetirizine HCl (ZYRTEC ALLERGY) 10 MG CAPS, Take 1 capsule (10 mg total) by mouth daily for 30 days., Disp: 30 capsule, Rfl: 0  .  Cholecalciferol (VITAMIN D) 2000 UNITS tablet, Take 1 tablet (2,000 Units total) by mouth daily., Disp: 90 tablet, Rfl: 1 .  famotidine (PEPCID) 40 MG tablet, Take 1 tablet (40 mg total) by mouth at bedtime., Disp: 30 tablet, Rfl: 6 .  ferrous sulfate 325 (65 FE) MG tablet, Take 325 mg by mouth daily with breakfast.  , Disp: , Rfl:  .  gabapentin (NEURONTIN) 100 MG capsule, Start with 1 tab three times per day as directed, Disp: 100 capsule, Rfl: 1 .  pantoprazole (PROTONIX) 40 MG tablet, Take 1 30 min. Before evening meal, Disp: 30 tablet, Rfl: 6  EXAM:   VITALS per patient if applicable: none reported  GENERAL: alert, oriented, appears well and in no acute distress  HEENT: atraumatic, conjunttiva clear, no obvious abnormalities on inspection of external nose and ears  NECK: normal movements of the head and neck  LUNGS: on inspection no signs of respiratory distress, breathing rate appears normal, no obvious gross increased work of breathing, gasping or wheezing  CV: no obvious cyanosis  MS: moves all visible extremities without noticeable abnormality  PSYCH/NEURO: pleasant and cooperative, no obvious depression or anxiety, speech and thought processing grossly intact  ASSESSMENT AND PLAN:   Hypercholesterolemia -I would advised continued use of lipitor and recheck lipids during CPE for further recommendations.  Gastroesophageal reflux disease without esophagitis -Not on meds and asymptomatic.  Allergic rhinitis, unspecified seasonality, unspecified trigger -Advised daily antihistamine and mucinex use. -She will contact us if symptoms worsen. -Can consider a CXR.     I discussed the assessment and treatment plan with the patient. The patient was provided an opportunity to ask questions and all were answered. The patient agreed with the plan and demonstrated an understanding of the instructions.   The patient was advised to call back or seek an in-person evaluation if  the symptoms worsen or if the condition fails to improve as anticipated.    Lelon Frohlich, MD  [facility]

## 2018-10-25 ENCOUNTER — Encounter: Payer: Self-pay | Admitting: Internal Medicine

## 2018-10-25 ENCOUNTER — Other Ambulatory Visit: Payer: Self-pay

## 2018-10-25 ENCOUNTER — Ambulatory Visit (INDEPENDENT_AMBULATORY_CARE_PROVIDER_SITE_OTHER): Payer: 59 | Admitting: Internal Medicine

## 2018-10-25 ENCOUNTER — Other Ambulatory Visit: Payer: Self-pay | Admitting: Internal Medicine

## 2018-10-25 VITALS — BP 110/70 | HR 71 | Temp 98.4°F | Ht 65.0 in | Wt 159.6 lb

## 2018-10-25 DIAGNOSIS — Z0000 Encounter for general adult medical examination without abnormal findings: Secondary | ICD-10-CM

## 2018-10-25 DIAGNOSIS — E559 Vitamin D deficiency, unspecified: Secondary | ICD-10-CM

## 2018-10-25 DIAGNOSIS — K219 Gastro-esophageal reflux disease without esophagitis: Secondary | ICD-10-CM | POA: Diagnosis not present

## 2018-10-25 DIAGNOSIS — Z23 Encounter for immunization: Secondary | ICD-10-CM | POA: Diagnosis not present

## 2018-10-25 DIAGNOSIS — Z124 Encounter for screening for malignant neoplasm of cervix: Secondary | ICD-10-CM

## 2018-10-25 DIAGNOSIS — E7800 Pure hypercholesterolemia, unspecified: Secondary | ICD-10-CM | POA: Diagnosis not present

## 2018-10-25 DIAGNOSIS — E119 Type 2 diabetes mellitus without complications: Secondary | ICD-10-CM | POA: Insufficient documentation

## 2018-10-25 DIAGNOSIS — R7302 Impaired glucose tolerance (oral): Secondary | ICD-10-CM | POA: Insufficient documentation

## 2018-10-25 LAB — COMPREHENSIVE METABOLIC PANEL
ALT: 10 U/L (ref 0–35)
AST: 14 U/L (ref 0–37)
Albumin: 4.2 g/dL (ref 3.5–5.2)
Alkaline Phosphatase: 73 U/L (ref 39–117)
BUN: 14 mg/dL (ref 6–23)
CO2: 29 mEq/L (ref 19–32)
Calcium: 9.5 mg/dL (ref 8.4–10.5)
Chloride: 106 mEq/L (ref 96–112)
Creatinine, Ser: 0.77 mg/dL (ref 0.40–1.20)
GFR: 92.35 mL/min (ref >60.00)
Glucose, Bld: 116 mg/dL — ABNORMAL HIGH (ref 70–99)
Potassium: 4.5 mEq/L (ref 3.5–5.1)
Sodium: 142 mEq/L (ref 135–145)
Total Bilirubin: 0.5 mg/dL (ref 0.2–1.2)
Total Protein: 6.6 g/dL (ref 6.0–8.3)

## 2018-10-25 LAB — CBC WITH DIFFERENTIAL/PLATELET
Basophils Absolute: 0 10*3/uL (ref 0.0–0.1)
Basophils Relative: 0.6 % (ref 0.0–3.0)
Eosinophils Absolute: 0.1 10*3/uL (ref 0.0–0.7)
Eosinophils Relative: 2.1 % (ref 0.0–5.0)
HCT: 38.2 % (ref 36.0–46.0)
Hemoglobin: 13 g/dL (ref 12.0–15.0)
Lymphocytes Relative: 36.1 % (ref 12.0–46.0)
Lymphs Abs: 1.3 10*3/uL (ref 0.7–4.0)
MCHC: 33.9 g/dL (ref 30.0–36.0)
MCV: 80.4 fl (ref 78.0–100.0)
Monocytes Absolute: 0.2 10*3/uL (ref 0.1–1.0)
Monocytes Relative: 5.9 % (ref 3.0–12.0)
Neutro Abs: 2 10*3/uL (ref 1.4–7.7)
Neutrophils Relative %: 55.3 % (ref 43.0–77.0)
Platelets: 227 10*3/uL (ref 150.0–400.0)
RBC: 4.76 Mil/uL (ref 3.87–5.11)
RDW: 14.8 % (ref 11.5–15.5)
WBC: 3.6 10*3/uL — ABNORMAL LOW (ref 4.0–10.5)

## 2018-10-25 LAB — LIPID PANEL
Cholesterol: 215 mg/dL — ABNORMAL HIGH (ref 0–200)
HDL: 89.7 mg/dL (ref >39.00)
LDL Cholesterol: 116 mg/dL — ABNORMAL HIGH (ref 0–99)
NonHDL: 125.62
Total CHOL/HDL Ratio: 2
Triglycerides: 48 mg/dL (ref 0.0–149.0)
VLDL: 9.6 mg/dL (ref 0.0–40.0)

## 2018-10-25 LAB — VITAMIN B12: Vitamin B-12: 268 pg/mL (ref 211–911)

## 2018-10-25 LAB — VITAMIN D 25 HYDROXY (VIT D DEFICIENCY, FRACTURES): VITD: 22.18 ng/mL — ABNORMAL LOW (ref 30.00–100.00)

## 2018-10-25 LAB — TSH: TSH: 1.15 u[IU]/mL (ref 0.35–4.50)

## 2018-10-25 LAB — HEMOGLOBIN A1C: Hgb A1c MFr Bld: 6.5 % (ref 4.6–6.5)

## 2018-10-25 MED ORDER — VITAMIN D (ERGOCALCIFEROL) 1.25 MG (50000 UNIT) PO CAPS: 50000.0000 [IU] | ORAL_CAPSULE | ORAL | 0 refills | Status: DC

## 2018-10-25 NOTE — Patient Instructions (Signed)
-Nice seeing you today!  -Lab work today. Will notify you when results are available.  -1 of 2 shingles vaccinations today.  -Make sure you have dental care.  -We will refer you to GYN.   Preventive Care 40-64 Years, Female Preventive care refers to lifestyle choices and visits with your health care provider that can promote health and wellness. What does preventive care include?   A yearly physical exam. This is also called an annual well check.  Dental exams once or twice a year.  Routine eye exams. Ask your health care provider how often you should have your eyes checked.  Personal lifestyle choices, including: ? Daily care of your teeth and gums. ? Regular physical activity. ? Eating a healthy diet. ? Avoiding tobacco and drug use. ? Limiting alcohol use. ? Practicing safe sex. ? Taking low-dose aspirin daily starting at age 75. ? Taking vitamin and mineral supplements as recommended by your health care provider. What happens during an annual well check? The services and screenings done by your health care provider during your annual well check will depend on your age, overall health, lifestyle risk factors, and family history of disease. Counseling Your health care provider may ask you questions about your:  Alcohol use.  Tobacco use.  Drug use.  Emotional well-being.  Home and relationship well-being.  Sexual activity.  Eating habits.  Work and work Statistician.  Method of birth control.  Menstrual cycle.  Pregnancy history. Screening You may have the following tests or measurements:  Height, weight, and BMI.  Blood pressure.  Lipid and cholesterol levels. These may be checked every 5 years, or more frequently if you are over 32 years old.  Skin check.  Lung cancer screening. You may have this screening every year starting at age 83 if you have a 30-pack-year history of smoking and currently smoke or have quit within the past 15 years.   Colorectal cancer screening. All adults should have this screening starting at age 54 and continuing until age 55. Your health care provider may recommend screening at age 17. You will have tests every 1-10 years, depending on your results and the type of screening test. People at increased risk should start screening at an earlier age. Screening tests may include: ? Guaiac-based fecal occult blood testing. ? Fecal immunochemical test (FIT). ? Stool DNA test. ? Virtual colonoscopy. ? Sigmoidoscopy. During this test, a flexible tube with a tiny camera (sigmoidoscope) is used to examine your rectum and lower colon. The sigmoidoscope is inserted through your anus into your rectum and lower colon. ? Colonoscopy. During this test, a long, thin, flexible tube with a tiny camera (colonoscope) is used to examine your entire colon and rectum.  Hepatitis C blood test.  Hepatitis B blood test.  Sexually transmitted disease (STD) testing.  Diabetes screening. This is done by checking your blood sugar (glucose) after you have not eaten for a while (fasting). You may have this done every 1-3 years.  Mammogram. This may be done every 1-2 years. Talk to your health care provider about when you should start having regular mammograms. This may depend on whether you have a family history of breast cancer.  BRCA-related cancer screening. This may be done if you have a family history of breast, ovarian, tubal, or peritoneal cancers.  Pelvic exam and Pap test. This may be done every 3 years starting at age 68. Starting at age 69, this may be done every 5 years if you have a  Pap test in combination with an HPV test.  Bone density scan. This is done to screen for osteoporosis. You may have this scan if you are at high risk for osteoporosis. Discuss your test results, treatment options, and if necessary, the need for more tests with your health care provider. Vaccines Your health care provider may recommend certain  vaccines, such as:  Influenza vaccine. This is recommended every year.  Tetanus, diphtheria, and acellular pertussis (Tdap, Td) vaccine. You may need a Td booster every 10 years.  Varicella vaccine. You may need this if you have not been vaccinated.  Zoster vaccine. You may need this after age 60.  Measles, mumps, and rubella (MMR) vaccine. You may need at least one dose of MMR if you were born in 1957 or later. You may also need a second dose.  Pneumococcal 13-valent conjugate (PCV13) vaccine. You may need this if you have certain conditions and were not previously vaccinated.  Pneumococcal polysaccharide (PPSV23) vaccine. You may need one or two doses if you smoke cigarettes or if you have certain conditions.  Meningococcal vaccine. You may need this if you have certain conditions.  Hepatitis A vaccine. You may need this if you have certain conditions or if you travel or work in places where you may be exposed to hepatitis A.  Hepatitis B vaccine. You may need this if you have certain conditions or if you travel or work in places where you may be exposed to hepatitis B.  Haemophilus influenzae type b (Hib) vaccine. You may need this if you have certain conditions. Talk to your health care provider about which screenings and vaccines you need and how often you need them. This information is not intended to replace advice given to you by your health care provider. Make sure you discuss any questions you have with your health care provider. Document Released: 06/11/2015 Document Revised: 07/05/2017 Document Reviewed: 03/16/2015 Elsevier Interactive Patient Education  2019 [employer].

## 2018-10-25 NOTE — Progress Notes (Signed)
Established Patient Office Visit     CC/Reason for Visit: CPE  HPI: Yvonne Marshall Yvonne Marshall is a 63 y.o. female who is coming in today for the above mentioned reasons. Past Medical History is significant for: HLD for which she resumed taking lipitor after our consultation last month, GERD well controlled off medications, seasonal allergies. She mentions she is concerned about her liver enzymes and lipitor. She has no acute complaints today.  Never smoker, occasional ETOH. Has routine eye care, no dental care. UTD on required immunizations; discussed shingles vaccination. Due for mammogram, she requests referral to GYN for cervical cancer screening. She had a colonoscopy in 2014 and was asked to return in 10 years (2024).   Past Medical/Surgical History: Past Medical History:  Diagnosis Date  . Allergic rhinitis   . Anemia, mild   . Anxiety   . Asthma, mild   . Chest pain, atypical   . Degenerative joint disease   . GERD (gastroesophageal reflux disease)   . Hemorrhoids   . Hyperlipidemia   . IBS (irritable bowel syndrome)     Past Surgical History:  Procedure Laterality Date  . EXPLORATORY LAPAROTOMY     for adhesions  . HYSTEROSCOPY  1998  . ROTATOR CUFF REPAIR Left 1990    Social History:  reports that she has never smoked. She has never used smokeless tobacco. She reports current alcohol use of about 3.0 - 4.0 standard drinks of alcohol per week. She reports that she does not use drugs.  Allergies: No Known Allergies  Family History:  Family History  Problem Relation Age of Onset  . Alcohol abuse Father   . Cancer Unknown        sibling  . Breast cancer Maternal Grandmother        60's  . Colon cancer Neg Hx   . Esophageal cancer Neg Hx   . Rectal cancer Neg Hx   . Stomach cancer Neg Hx      Current Outpatient Medications:  .  atorvastatin (LIPITOR) 10 MG tablet, TAKE 1 TABLET BY MOUTH EVERY DAY, Disp: 90 tablet, Rfl: 1 .  Cholecalciferol (VITAMIN D)  2000 UNITS tablet, Take 1 tablet (2,000 Units total) by mouth daily., Disp: 90 tablet, Rfl: 1 .  ferrous sulfate 325 (65 FE) MG tablet, Take 325 mg by mouth daily with breakfast.  , Disp: , Rfl:   Review of Systems:  Constitutional: Denies fever, chills, diaphoresis, appetite change and fatigue.  HEENT: Denies photophobia, eye pain, redness, hearing loss, ear pain, congestion, sore throat, rhinorrhea, sneezing, mouth sores, trouble swallowing, neck pain, neck stiffness and tinnitus.   Respiratory: Denies SOB, DOE, cough, chest tightness,  and wheezing.   Cardiovascular: Denies chest pain, palpitations and leg swelling.  Gastrointestinal: Denies nausea, vomiting, abdominal pain, diarrhea, constipation, blood in stool and abdominal distention.  Genitourinary: Denies dysuria, urgency, frequency, hematuria, flank pain and difficulty urinating.  Endocrine: Denies: hot or cold intolerance, sweats, changes in hair or nails, polyuria, polydipsia. Musculoskeletal: Denies myalgias, back pain, joint swelling, arthralgias and gait problem.  Skin: Denies pallor, rash and wound.  Neurological: Denies dizziness, seizures, syncope, weakness, light-headedness, numbness and headaches.  Hematological: Denies adenopathy. Easy bruising, personal or family bleeding history  Psychiatric/Behavioral: Denies suicidal ideation, mood changes, confusion, nervousness, sleep disturbance and agitation    Physical Exam: Vitals:   10/25/18 0813  BP: 110/70  Pulse: 71  Temp: 98.4 F (36.9 C)  TempSrc: Oral  SpO2: 96%  Weight: 159 lb  9.6 oz (72.4 kg)  Height: '5\' 5"'$  (1.651 m)    Body mass index is 26.56 kg/m.   Constitutional: NAD, calm, comfortable Eyes: PERRL, lids and conjunctivae normal ENMT: Mucous membranes are moist. Posterior pharynx clear of any exudate or lesions. Normal dentition. Tympanic membrane is pearly white, no erythema or bulging. Neck: normal, supple, no masses, no thyromegaly Respiratory:  clear to auscultation bilaterally, no wheezing, no crackles. Normal respiratory effort. No accessory muscle use.  Cardiovascular: Regular rate and rhythm, no murmurs / rubs / gallops. No extremity edema. 2+ pedal pulses. No carotid bruits.  Abdomen: no tenderness, no masses palpated. No hepatosplenomegaly. Bowel sounds positive.  Musculoskeletal: no clubbing / cyanosis. No joint deformity upper and lower extremities. Good ROM, no contractures. Normal muscle tone.  Skin: no rashes, lesions, ulcers. No induration Neurologic: CN 2-12 grossly intact. Sensation intact, DTR normal. Strength 5/5 in all 4.  Psychiatric: Normal judgment and insight. Alert and oriented x 3. Normal mood.    Impression and Plan:  Encounter for preventive health examination -UTI on immunizations; will receive shingles today. -GYN for pap, mammo requested, colon cancer screening good until 2024. -Screening labs today. -She has routine eye care; have advised routine dental care. -Discussed healthy lifestyle, including increased physical activity and better food choices to promote weight loss.  Hypercholesterolemia  -Continue lipitor for now. -Recheck lipids and LFTs today.  Gastroesophageal reflux disease without esophagitis -Well controlled not on meds.     Patient Instructions  -Nice seeing you today!  -Lab work today. Will notify you when results are available.  -1 of 2 shingles vaccinations today.  -Make sure you have dental care.  -We will refer you to GYN.   Preventive Care 40-64 Years, Female Preventive care refers to lifestyle choices and visits with your health care provider that can promote health and wellness. What does preventive care include?   A yearly physical exam. This is also called an annual well check.  Dental exams once or twice a year.  Routine eye exams. Ask your health care provider how often you should have your eyes checked.  Personal lifestyle choices, including: ?  Daily care of your teeth and gums. ? Regular physical activity. ? Eating a healthy diet. ? Avoiding tobacco and drug use. ? Limiting alcohol use. ? Practicing safe sex. ? Taking low-dose aspirin daily starting at age 55. ? Taking vitamin and mineral supplements as recommended by your health care provider. What happens during an annual well check? The services and screenings done by your health care provider during your annual well check will depend on your age, overall health, lifestyle risk factors, and family history of disease. Counseling Your health care provider may ask you questions about your:  Alcohol use.  Tobacco use.  Drug use.  Emotional well-being.  Home and relationship well-being.  Sexual activity.  Eating habits.  Work and work Statistician.  Method of birth control.  Menstrual cycle.  Pregnancy history. Screening You may have the following tests or measurements:  Height, weight, and BMI.  Blood pressure.  Lipid and cholesterol levels. These may be checked every 5 years, or more frequently if you are over 65 years old.  Skin check.  Lung cancer screening. You may have this screening every year starting at age 93 if you have a 30-pack-year history of smoking and currently smoke or have quit within the past 15 years.  Colorectal cancer screening. All adults should have this screening starting at age 39 and continuing until  age 58. Your health care provider may recommend screening at age 56. You will have tests every 1-10 years, depending on your results and the type of screening test. People at increased risk should start screening at an earlier age. Screening tests may include: ? Guaiac-based fecal occult blood testing. ? Fecal immunochemical test (FIT). ? Stool DNA test. ? Virtual colonoscopy. ? Sigmoidoscopy. During this test, a flexible tube with a tiny camera (sigmoidoscope) is used to examine your rectum and lower colon. The sigmoidoscope is  inserted through your anus into your rectum and lower colon. ? Colonoscopy. During this test, a long, thin, flexible tube with a tiny camera (colonoscope) is used to examine your entire colon and rectum.  Hepatitis C blood test.  Hepatitis B blood test.  Sexually transmitted disease (STD) testing.  Diabetes screening. This is done by checking your blood sugar (glucose) after you have not eaten for a while (fasting). You may have this done every 1-3 years.  Mammogram. This may be done every 1-2 years. Talk to your health care provider about when you should start having regular mammograms. This may depend on whether you have a family history of breast cancer.  BRCA-related cancer screening. This may be done if you have a family history of breast, ovarian, tubal, or peritoneal cancers.  Pelvic exam and Pap test. This may be done every 3 years starting at age 32. Starting at age 67, this may be done every 5 years if you have a Pap test in combination with an HPV test.  Bone density scan. This is done to screen for osteoporosis. You may have this scan if you are at high risk for osteoporosis. Discuss your test results, treatment options, and if necessary, the need for more tests with your health care provider. Vaccines Your health care provider may recommend certain vaccines, such as:  Influenza vaccine. This is recommended every year.  Tetanus, diphtheria, and acellular pertussis (Tdap, Td) vaccine. You may need a Td booster every 10 years.  Varicella vaccine. You may need this if you have not been vaccinated.  Zoster vaccine. You may need this after age 30.  Measles, mumps, and rubella (MMR) vaccine. You may need at least one dose of MMR if you were born in 1957 or later. You may also need a second dose.  Pneumococcal 13-valent conjugate (PCV13) vaccine. You may need this if you have certain conditions and were not previously vaccinated.  Pneumococcal polysaccharide (PPSV23) vaccine.  You may need one or two doses if you smoke cigarettes or if you have certain conditions.  Meningococcal vaccine. You may need this if you have certain conditions.  Hepatitis A vaccine. You may need this if you have certain conditions or if you travel or work in places where you may be exposed to hepatitis A.  Hepatitis B vaccine. You may need this if you have certain conditions or if you travel or work in places where you may be exposed to hepatitis B.  Haemophilus influenzae type b (Hib) vaccine. You may need this if you have certain conditions. Talk to your health care provider about which screenings and vaccines you need and how often you need them. This information is not intended to replace advice given to you by your health care provider. Make sure you discuss any questions you have with your health care provider. Document Released: 06/11/2015 Document Revised: 07/05/2017 Document Reviewed: 03/16/2015 Elsevier Interactive Patient Education  2019 [place],  MD [facility]

## 2018-10-30 ENCOUNTER — Telehealth: Payer: Self-pay | Admitting: *Deleted

## 2018-10-30 ENCOUNTER — Other Ambulatory Visit: Payer: Self-pay | Admitting: *Deleted

## 2018-10-30 NOTE — Telephone Encounter (Signed)
Copied from [place] [id number]. Topic: General - Call Back - No Documentation >> Oct 30, 2018  3:19 PM Erick Blinks wrote: Reason for CRM: Pt called requesting call back to discuss recent physical/labs. Please advise

## 2018-10-31 ENCOUNTER — Telehealth: Payer: Self-pay | Admitting: Internal Medicine

## 2018-10-31 ENCOUNTER — Other Ambulatory Visit: Payer: Self-pay | Admitting: Internal Medicine

## 2018-10-31 DIAGNOSIS — E559 Vitamin D deficiency, unspecified: Secondary | ICD-10-CM

## 2018-10-31 MED ORDER — VITAMIN D (ERGOCALCIFEROL) 1.25 MG (50000 UNIT) PO CAPS: 50000.0000 [IU] | ORAL_CAPSULE | ORAL | 0 refills | Status: AC

## 2018-10-31 NOTE — Telephone Encounter (Signed)
Copied from [place] [id number]. Topic: Quick Communication - Rx Refill/Question >> Oct 31, 2018 12:03 PM Rayann Heman wrote: Medication:Vitamin D, Ergocalciferol, (DRISDOL) 1.25 MG (50000 UT) CAPS capsule [[id number]] pt states that this was sent to wrong pharmacy.   [id number] [address], [place] (Phone) [phone] (Fax)

## 2018-10-31 NOTE — Telephone Encounter (Signed)
See lab note 10/30/2018

## 2018-11-07 ENCOUNTER — Other Ambulatory Visit: Payer: Self-pay | Admitting: Internal Medicine

## 2018-11-07 DIAGNOSIS — Z1231 Encounter for screening mammogram for malignant neoplasm of breast: Secondary | ICD-10-CM

## 2018-11-28 ENCOUNTER — Ambulatory Visit: Payer: Self-pay | Admitting: Gynecology

## 2018-12-05 ENCOUNTER — Telehealth: Payer: Self-pay | Admitting: Internal Medicine

## 2018-12-05 NOTE — Telephone Encounter (Signed)
Copied from [place] [id number]. Topic: Appointment Scheduling - Scheduling Inquiry for Clinic >> Dec 03, 2018  5:14 PM [place], [place] wrote: Reason for CRM: Pt stated she got in to some poison ivy and she would like to schedule an appt with Dr. Jerilee Hoh. Pt stated it is urgent. Pt requests call back. >> Dec 04, 2018  8:22 AM [facility], Melburn Hake, CMA wrote: Jerilee Hoh is on vacation. She can do virtual with another provider.     LVMV for the patient to call back an schedule an virtual appointment with one of our providers. Dr. Jerilee Hoh is on vacation this week.

## 2018-12-25 ENCOUNTER — Ambulatory Visit
Admission: RE | Admit: 2018-12-25 | Discharge: 2018-12-25 | Disposition: A | Discharge status: Home / Self Care | Payer: 59 | Source: Ambulatory Visit | Attending: Internal Medicine | Admitting: Internal Medicine

## 2018-12-25 ENCOUNTER — Other Ambulatory Visit: Payer: Self-pay

## 2018-12-25 DIAGNOSIS — Z1231 Encounter for screening mammogram for malignant neoplasm of breast: Secondary | ICD-10-CM

## 2019-01-20 ENCOUNTER — Other Ambulatory Visit: Payer: Self-pay

## 2019-01-20 ENCOUNTER — Other Ambulatory Visit (INDEPENDENT_AMBULATORY_CARE_PROVIDER_SITE_OTHER): Payer: 59

## 2019-01-20 DIAGNOSIS — E559 Vitamin D deficiency, unspecified: Secondary | ICD-10-CM

## 2019-01-21 LAB — VITAMIN D 25 HYDROXY (VIT D DEFICIENCY, FRACTURES): VITD: 42.52 ng/mL (ref 30.00–100.00)

## 2019-01-24 ENCOUNTER — Ambulatory Visit: Payer: 59 | Admitting: Internal Medicine

## 2019-01-31 ENCOUNTER — Ambulatory Visit: Payer: 59 | Admitting: Internal Medicine

## 2019-02-13 ENCOUNTER — Encounter: Payer: Self-pay | Admitting: Internal Medicine

## 2019-02-13 ENCOUNTER — Other Ambulatory Visit: Payer: Self-pay

## 2019-02-13 ENCOUNTER — Ambulatory Visit (INDEPENDENT_AMBULATORY_CARE_PROVIDER_SITE_OTHER): Payer: 59 | Admitting: Internal Medicine

## 2019-02-13 VITALS — BP 110/60 | HR 66 | Temp 98.0°F | Wt 154.3 lb

## 2019-02-13 DIAGNOSIS — Z23 Encounter for immunization: Secondary | ICD-10-CM | POA: Diagnosis not present

## 2019-02-13 DIAGNOSIS — N12 Tubulo-interstitial nephritis, not specified as acute or chronic: Secondary | ICD-10-CM

## 2019-02-13 NOTE — Addendum Note (Signed)
Addended by: Westley Hummer B on: 02/13/2019 04:28 PM   Modules accepted: Orders

## 2019-02-13 NOTE — Patient Instructions (Signed)
-Nice seeing you today!!  -Remember to take your antibiotic twice a day for 7 days and stay hydrated!  -Call for follow up if no improvement in 10 days.  -Shingles vaccine today.  -Schedule follow up in 6 months.   Pyelonephritis, Adult  Pyelonephritis is an infection that occurs in the kidney. The kidneys are organs that help clean the blood by moving waste out of the blood and into the pee (urine). This infection can happen quickly, or it can last for a long time. In most cases, it clears up with treatment and does not cause other problems. What are the causes? This condition may be caused by:  Germs (bacteria) going from the bladder up to the kidney. This may happen after having a bladder infection.  Germs going from the blood to the kidney. What increases the risk? This condition is more likely to develop in:  Pregnant women.  Older people.  People who have any of these conditions: ? Diabetes. ? Inflammation of the prostate gland (prostatitis), in males. ? Kidney stones or bladder stones. ? Other problems with the kidney or the parts of your body that carry pee from the kidneys to the bladder (ureters). ? Cancer.  People who have a small, thin tube (catheter) placed in the bladder.  People who are sexually active.  Women who use a medicine that kills sperm (spermicide) to prevent pregnancy.  People who have had a prior urinary tract infection (UTI). What are the signs or symptoms? Symptoms of this condition include:  Peeing often.  A strong urge to pee right away.  Burning or stinging when peeing.  Belly pain.  Back pain.  Pain in the side (flank area).  Fever or chills.  Blood in the pee, or dark pee.  Feeling sick to your stomach (nauseous) or throwing up (vomiting). How is this treated? This condition may be treated by:  Taking antibiotic medicines by mouth (orally).  Drinking enough fluids. If the infection is bad, you may need to stay in the  hospital. You may be given antibiotics and fluids that are put directly into a vein through an IV tube. In some cases, other treatments may be needed. Follow these instructions at home: Medicines  Take your antibiotic medicine as told by your doctor. Do not stop taking the antibiotic even if you start to feel better.  Take over-the-counter and prescription medicines only as told by your doctor. General instructions   Drink enough fluid to keep your pee pale yellow.  Avoid caffeine, tea, and carbonated drinks.  Pee (urinate) often. Avoid holding in pee for long periods of time.  Pee before and after sex.  After pooping (having a bowel movement), women should wipe from front to back. Use each tissue only once.  Keep all follow-up visits as told by your doctor. This is important. Contact a doctor if:  You do not feel better after 2 days.  Your symptoms get worse.  You have a fever. Get help right away if:  You cannot take your medicine or drink fluids as told.  You have chills and shaking.  You throw up.  You have very bad pain in your side or back.  You feel very weak or you pass out (faint). Summary  Pyelonephritis is an infection that occurs in the kidney.  In most cases, this infection clears up with treatment and does not cause other problems.  Take your antibiotic medicine as told by your doctor. Do not stop taking the  antibiotic even if you start to feel better.  Drink enough fluid to keep your pee pale yellow. This information is not intended to replace advice given to you by your health care provider. Make sure you discuss any questions you have with your health care provider. Document Released: 06/22/2004 Document Revised: 03/19/2018 Document Reviewed: 03/19/2018 Elsevier Patient Education  2020 [employer].

## 2019-02-13 NOTE — Progress Notes (Signed)
Acute Office Visit     CC/Reason for Visit: Urgent care follow-up  HPI: Yvonne Marshall is a 63 y.o. female who is coming in today for the above mentioned reasons.  She is following up with me after an urgent care visit last evening.  She had just finished dinner when she had a sudden pain in her right lower back radiating towards the front into the groin and suprapubic area.  She had nausea and vomiting.  She also started experiencing dysuria and urinary frequency.  Because the pain in her right flank was so severe, she decided to go to a [facility] med urgent care last night.  She was told in a urine sample that she had a lot of bacteria and blood and was started on Cipro 500 mg twice daily for 7 days as well as Zofran.  She is also due for her second shingles vaccination today.   Past Medical/Surgical History: Past Medical History:  Diagnosis Date  . Allergic rhinitis   . Anemia, mild   . Anxiety   . Asthma, mild   . Chest pain, atypical   . Degenerative joint disease   . GERD (gastroesophageal reflux disease)   . Hemorrhoids   . Hyperlipidemia   . IBS (irritable bowel syndrome)     Past Surgical History:  Procedure Laterality Date  . EXPLORATORY LAPAROTOMY     for adhesions  . HYSTEROSCOPY  1998  . ROTATOR CUFF REPAIR Left 1990    Social History:  reports that she has never smoked. She has never used smokeless tobacco. She reports current alcohol use of about 3.0 - 4.0 standard drinks of alcohol per week. She reports that she does not use drugs.  Allergies: No Known Allergies  Family History:  Family History  Problem Relation Age of Onset  . Alcohol abuse Father   . Cancer Other        sibling  . Breast cancer Maternal Grandmother        60's  . Colon cancer Neg Hx   . Esophageal cancer Neg Hx   . Rectal cancer Neg Hx   . Stomach cancer Neg Hx      Current Outpatient Medications:  .  atorvastatin (LIPITOR) 10 MG tablet, TAKE 1 TABLET BY MOUTH EVERY DAY,  Disp: 90 tablet, Rfl: 1 .  Cholecalciferol (VITAMIN D) 2000 UNITS tablet, Take 1 tablet (2,000 Units total) by mouth daily., Disp: 90 tablet, Rfl: 1 .  ciprofloxacin (CIPRO) 500 MG tablet, , Disp: , Rfl:  .  ferrous sulfate 325 (65 FE) MG tablet, Take 325 mg by mouth daily with breakfast.  , Disp: , Rfl:  .  ondansetron (ZOFRAN-ODT) 8 MG disintegrating tablet, , Disp: , Rfl:   Review of Systems:  Constitutional: Denies fever, chills, diaphoresis, appetite change and fatigue.  HEENT: Denies photophobia, eye pain, redness, hearing loss, ear pain, congestion, sore throat, rhinorrhea, sneezing, mouth sores, trouble swallowing, neck pain, neck stiffness and tinnitus.   Respiratory: Denies SOB, DOE, cough, chest tightness,  and wheezing.   Cardiovascular: Denies chest pain, palpitations and leg swelling.  Gastrointestinal: Denies nausea, vomiting, abdominal pain, diarrhea, constipation, blood in stool and abdominal distention.  Genitourinary: Positive for dysuria, urgency, frequency, hematuria, flank pain denies difficulty urinating.  Endocrine: Denies: hot or cold intolerance, sweats, changes in hair or nails, polyuria, polydipsia. Musculoskeletal: Denies myalgias, back pain, joint swelling, arthralgias and gait problem.  Skin: Denies pallor, rash and wound.  Neurological: Denies dizziness, seizures,  syncope, weakness, light-headedness, numbness and headaches.  Hematological: Denies adenopathy. Easy bruising, personal or family bleeding history  Psychiatric/Behavioral: Denies suicidal ideation, mood changes, confusion, nervousness, sleep disturbance and agitation    Physical Exam: Vitals:   02/13/19 0759 02/13/19 0817  BP: 98/60 110/60  Pulse: 66   Temp: 98 F (36.7 C)   TempSrc: Temporal   SpO2: 99%   Weight: 154 lb 4.8 oz (70 kg)     Body mass index is 25.68 kg/m.    Constitutional: NAD, calm, comfortable Eyes: PERRL, lids and conjunctivae normal, wears corrective lenses ENMT:  Mucous membranes are dry.  Respiratory: clear to auscultation bilaterally, no wheezing, no crackles. Normal respiratory effort. No accessory muscle use.  Cardiovascular: Regular rate and rhythm, no murmurs / rubs / gallops. No extremity edema. 2+ pedal pulses. No carotid bruits.  Abdomen: Positive CVA tenderness on the right, no masses palpated. No hepatosplenomegaly. Bowel sounds positive.  Psychiatric: Normal judgment and insight. Alert and oriented x 3. Normal mood.    Impression and Plan:  Pyelonephritis -Advised that she complete out antibiotic therapy, advised aggressive oral fluid hydration. -She will contact us if she has pain that persists past 10 days, at that point may consider CT scan to rule out nephrolithiasis. -She is advised to contact us for recommendations if she develops severe nausea and vomiting to the point where she is unable to tolerate her antibiotic or maintain oral fluid hydration. -She was given Zofran last night in urgent care.   Patient Instructions  -Nice seeing you today!!  -Remember to take your antibiotic twice a day for 7 days and stay hydrated!  -Call for follow up if no improvement in 10 days.  -Shingles vaccine today.  -Schedule follow up in 6 months.   Pyelonephritis, Adult  Pyelonephritis is an infection that occurs in the kidney. The kidneys are organs that help clean the blood by moving waste out of the blood and into the pee (urine). This infection can happen quickly, or it can last for a long time. In most cases, it clears up with treatment and does not cause other problems. What are the causes? This condition may be caused by:  Germs (bacteria) going from the bladder up to the kidney. This may happen after having a bladder infection.  Germs going from the blood to the kidney. What increases the risk? This condition is more likely to develop in:  Pregnant women.  Older people.  People who have any of these conditions: ?  Diabetes. ? Inflammation of the prostate gland (prostatitis), in males. ? Kidney stones or bladder stones. ? Other problems with the kidney or the parts of your body that carry pee from the kidneys to the bladder (ureters). ? Cancer.  People who have a small, thin tube (catheter) placed in the bladder.  People who are sexually active.  Women who use a medicine that kills sperm (spermicide) to prevent pregnancy.  People who have had a prior urinary tract infection (UTI). What are the signs or symptoms? Symptoms of this condition include:  Peeing often.  A strong urge to pee right away.  Burning or stinging when peeing.  Belly pain.  Back pain.  Pain in the side (flank area).  Fever or chills.  Blood in the pee, or dark pee.  Feeling sick to your stomach (nauseous) or throwing up (vomiting). How is this treated? This condition may be treated by:  Taking antibiotic medicines by mouth (orally).  Drinking enough fluids. If the  infection is bad, you may need to stay in the hospital. You may be given antibiotics and fluids that are put directly into a vein through an IV tube. In some cases, other treatments may be needed. Follow these instructions at home: Medicines  Take your antibiotic medicine as told by your doctor. Do not stop taking the antibiotic even if you start to feel better.  Take over-the-counter and prescription medicines only as told by your doctor. General instructions   Drink enough fluid to keep your pee pale yellow.  Avoid caffeine, tea, and carbonated drinks.  Pee (urinate) often. Avoid holding in pee for long periods of time.  Pee before and after sex.  After pooping (having a bowel movement), women should wipe from front to back. Use each tissue only once.  Keep all follow-up visits as told by your doctor. This is important. Contact a doctor if:  You do not feel better after 2 days.  Your symptoms get worse.  You have a fever. Get  help right away if:  You cannot take your medicine or drink fluids as told.  You have chills and shaking.  You throw up.  You have very bad pain in your side or back.  You feel very weak or you pass out (faint). Summary  Pyelonephritis is an infection that occurs in the kidney.  In most cases, this infection clears up with treatment and does not cause other problems.  Take your antibiotic medicine as told by your doctor. Do not stop taking the antibiotic even if you start to feel better.  Drink enough fluid to keep your pee pale yellow. This information is not intended to replace advice given to you by your health care provider. Make sure you discuss any questions you have with your health care provider. Document Released: 06/22/2004 Document Revised: 03/19/2018 Document Reviewed: 03/19/2018 Elsevier Patient Education  2020 [place], MD [facility]

## 2019-02-25 ENCOUNTER — Encounter: Payer: Self-pay | Admitting: Gynecology

## 2019-04-16 ENCOUNTER — Other Ambulatory Visit: Payer: Self-pay

## 2019-04-17 ENCOUNTER — Encounter: Payer: Self-pay | Admitting: Internal Medicine

## 2019-04-17 ENCOUNTER — Ambulatory Visit (INDEPENDENT_AMBULATORY_CARE_PROVIDER_SITE_OTHER): Payer: 59 | Admitting: Internal Medicine

## 2019-04-17 VITALS — BP 130/80 | HR 78 | Temp 97.9°F | Wt 158.1 lb

## 2019-04-17 DIAGNOSIS — K5900 Constipation, unspecified: Secondary | ICD-10-CM | POA: Diagnosis not present

## 2019-04-17 DIAGNOSIS — Z23 Encounter for immunization: Secondary | ICD-10-CM | POA: Diagnosis not present

## 2019-04-17 DIAGNOSIS — M546 Pain in thoracic spine: Secondary | ICD-10-CM

## 2019-04-17 MED ORDER — ATORVASTATIN CALCIUM 10 MG PO TABS: 10.0000 mg | ORAL_TABLET | Freq: Every day | ORAL | 1 refills | Status: DC

## 2019-04-17 NOTE — Progress Notes (Signed)
Acute office Visit     CC/Reason for Visit: Upper back pain and constipation  HPI: Yvonne Marshall is a 63 y.o. female who is coming in today for the above mentioned reasons.  She has 2 acute complaints today:  1.  She is having upper back pain around her neck and shoulder area.  She has been doing a lot of computer work and recognizes that she has bad posture.  2.  She has been having constipation lately and would like to know what she can take.  3.  She is requesting flu vaccination today.  Past Medical/Surgical History: Past Medical History:  Diagnosis Date  . Allergic rhinitis   . Anemia, mild   . Anxiety   . Asthma, mild   . Chest pain, atypical   . Degenerative joint disease   . GERD (gastroesophageal reflux disease)   . Hemorrhoids   . Hyperlipidemia   . IBS (irritable bowel syndrome)     Past Surgical History:  Procedure Laterality Date  . EXPLORATORY LAPAROTOMY     for adhesions  . HYSTEROSCOPY  1998  . ROTATOR CUFF REPAIR Left 1990    Social History:  reports that she has never smoked. She has never used smokeless tobacco. She reports current alcohol use of about 3.0 - 4.0 standard drinks of alcohol per week. She reports that she does not use drugs.  Allergies: No Known Allergies  Family History:  Family History  Problem Relation Age of Onset  . Alcohol abuse Father   . Cancer Other        sibling  . Breast cancer Maternal Grandmother        60's  . Colon cancer Neg Hx   . Esophageal cancer Neg Hx   . Rectal cancer Neg Hx   . Stomach cancer Neg Hx      Current Outpatient Medications:  .  atorvastatin (LIPITOR) 10 MG tablet, Take 1 tablet (10 mg total) by mouth daily., Disp: 90 tablet, Rfl: 1 .  Cholecalciferol (VITAMIN D) 2000 UNITS tablet, Take 1 tablet (2,000 Units total) by mouth daily., Disp: 90 tablet, Rfl: 1 .  ciprofloxacin (CIPRO) 500 MG tablet, , Disp: , Rfl:  .  ferrous sulfate 325 (65 FE) MG tablet, Take 325 mg by mouth  daily with breakfast.  , Disp: , Rfl:  .  ondansetron (ZOFRAN-ODT) 8 MG disintegrating tablet, , Disp: , Rfl:   Review of Systems:  Constitutional: Denies fever, chills, diaphoresis, appetite change and fatigue.  HEENT: Denies photophobia, eye pain, redness, hearing loss, ear pain, congestion, sore throat, rhinorrhea, sneezing, mouth sores, trouble swallowing, neck pain, neck stiffness and tinnitus.   Respiratory: Denies SOB, DOE, cough, chest tightness,  and wheezing.   Cardiovascular: Denies chest pain, palpitations and leg swelling.  Gastrointestinal: Denies nausea, vomiting, abdominal pain, diarrhea, blood in stool and abdominal distention.  Genitourinary: Denies dysuria, urgency, frequency, hematuria, flank pain and difficulty urinating.  Endocrine: Denies: hot or cold intolerance, sweats, changes in hair or nails, polyuria, polydipsia. Musculoskeletal: Denies myalgias, joint swelling, arthralgias and gait problem.  Skin: Denies pallor, rash and wound.  Neurological: Denies dizziness, seizures, syncope, weakness, light-headedness, numbness and headaches.  Hematological: Denies adenopathy. Easy bruising, personal or family bleeding history  Psychiatric/Behavioral: Denies suicidal ideation, mood changes, confusion, nervousness, sleep disturbance and agitation    Physical Exam: Vitals:   04/17/19 1545  BP: 130/80  Pulse: 78  Temp: 97.9 F (36.6 C)  TempSrc: Temporal  SpO2: 96%  Weight: 158 lb 1.6 oz (71.7 kg)    Body mass index is 26.31 kg/m.   Constitutional: NAD, calm, comfortable Eyes: PERRL, lids and conjunctivae normal ENMT: Mucous membranes are moist. Musculoskeletal: no clubbing / cyanosis. No joint deformity upper and lower extremities. Good ROM, no contractures. Normal muscle tone.  Significant tension and spasm of her left trapezius muscle. Skin: no rashes, lesions, ulcers. No induration Neurologic: Grossly intact and nonfocal Psychiatric: Normal judgment and  insight. Alert and oriented x 3. Normal mood.    Impression and Plan:  Acute left-sided thoracic back pain -Suspect this is muscle tension in the area of her left trapezius muscle. -Have advised icing, ibuprofen for 5 days, massage therapy and back stretches which she has been provided with. -She has no concerning signs, has full range of motion of her shoulder. -She knows to return to clinic if she has no improvement.  Constipation, unspecified constipation type -She has MiraLAX at home, she has been advised that she can take it once or twice a day as needed for constipation, she has also been advised to increase her water intake.  Needs flu shot  - Plan: Flu Vaccine QUAD 6+ mos PF IM (Fluarix Quad PF)   Patient Instructions  -Nice seeing you today!!  -Icing twice a day for 15 minutes, ibuprofen up to 2 tabs twice a day for 5 days and local massage therapy. Back stretches every day.  -Increase water intake and take miralax daily for your constipation.     Lelon Frohlich, MD [facility] at [facility]

## 2019-04-17 NOTE — Addendum Note (Signed)
Addended by: Erline Hau on: 04/17/2019 05:22 PM   Modules accepted: Level of Service

## 2019-04-17 NOTE — Patient Instructions (Signed)
-  Nice seeing you today!!  -Icing twice a day for 15 minutes, ibuprofen up to 2 tabs twice a day for 5 days and local massage therapy. Back stretches every day.  -Increase water intake and take miralax daily for your constipation.

## 2019-04-21 ENCOUNTER — Encounter: Payer: Self-pay | Admitting: Family Medicine

## 2019-04-21 ENCOUNTER — Ambulatory Visit (INDEPENDENT_AMBULATORY_CARE_PROVIDER_SITE_OTHER): Payer: 59 | Admitting: Family Medicine

## 2019-04-21 ENCOUNTER — Other Ambulatory Visit: Payer: Self-pay

## 2019-04-21 VITALS — BP 132/74 | HR 55 | Temp 97.2°F | Ht 66.0 in | Wt 157.6 lb

## 2019-04-21 DIAGNOSIS — M545 Low back pain, unspecified: Secondary | ICD-10-CM

## 2019-04-21 DIAGNOSIS — M542 Cervicalgia: Secondary | ICD-10-CM

## 2019-04-21 MED ORDER — METHOCARBAMOL 500 MG PO TABS: 500.0000 mg | ORAL_TABLET | Freq: Three times a day (TID) | ORAL | 0 refills | Status: DC | PRN

## 2019-04-21 NOTE — Patient Instructions (Signed)
Continue with the icing for the neck and back- and the stretches.  Would consider physical therapy trial if not improving over the next couple of weeks

## 2019-04-21 NOTE — Progress Notes (Signed)
Subjective:     Patient ID: Yvonne Marshall, female   DOB: 1956-01-20, 63 y.o.   MRN: [id number]  HPI  Patient is seen with neck and low back pain.  She states she has had some chronic low back pain off and on for years.  She had recurrence of pain starting this morning left lumbar area.  This is occasionally sharp and occasionally achy.  Pain is worse with sitting.  Also worse with change in position.  She has had some mild radiation toward the left upper thigh region.  No urine or stool incontinence.  No numbness or weakness.  Denies any injury.  Her job does not require any lifting or frequent bending.  She does spend quite a bit of time at the computer.  She has also had some pain left side of neck but no radiculitis symptoms.  Improved some with icing.    Past Medical History:  Diagnosis Date  . Allergic rhinitis   . Anemia, mild   . Anxiety   . Asthma, mild   . Chest pain, atypical   . Degenerative joint disease   . GERD (gastroesophageal reflux disease)   . Hemorrhoids   . Hyperlipidemia   . IBS (irritable bowel syndrome)    Past Surgical History:  Procedure Laterality Date  . EXPLORATORY LAPAROTOMY     for adhesions  . HYSTEROSCOPY  1998  . ROTATOR CUFF REPAIR Left 1990    reports that she has never smoked. She has never used smokeless tobacco. She reports current alcohol use of about 3.0 - 4.0 standard drinks of alcohol per week. She reports that she does not use drugs. family history includes Alcohol abuse in her father; Breast cancer in her maternal grandmother; Cancer in an other family member. No Known Allergies  Review of Systems  Constitutional: Negative for appetite change and unexpected weight change.  Respiratory: Negative for shortness of breath.   Cardiovascular: Negative for chest pain.  Gastrointestinal: Negative for abdominal pain.  Genitourinary: Negative for dysuria.  Musculoskeletal: Positive for back pain.  Neurological: Negative for weakness and  numbness.       Objective:   Physical Exam Vitals signs reviewed.  Constitutional:      Appearance: Normal appearance.  Neck:     Musculoskeletal: Neck supple.  Cardiovascular:     Rate and Rhythm: Normal rate and regular rhythm.  Pulmonary:     Effort: Pulmonary effort is normal.     Breath sounds: Normal breath sounds.  Musculoskeletal:     Comments: Straight leg raises are negative bilaterally.  She has good range of motion of the neck with flexion, extension, lateral bending, and rotation to the right and left side  She has some tenderness and muscle tension left trapezius  Neurological:     Mental Status: She is alert.     Comments: Full strength lower extremities.  2+ reflexes knee and ankle bilaterally        Assessment:     #1 chronic intermittent lumbar back pain.  Nonfocal neuro exam  #2 left-sided neck pain.  Suspect muscular    Plan:     -Continue with heat and/or ice for symptom relief along with muscle massage. -Trial of Robaxin 500 mg nightly for muscle tension symptoms -Suggested consideration for physical therapy but at this point she would like to give this another couple weeks. -Reviewed red flags of things to watch for with neck or low back pain-  Eulas Post MD [place] [facility]'

## 2019-05-05 ENCOUNTER — Emergency Department (HOSPITAL_COMMUNITY): Payer: 59

## 2019-05-05 ENCOUNTER — Other Ambulatory Visit: Payer: Self-pay

## 2019-05-05 ENCOUNTER — Encounter (HOSPITAL_COMMUNITY): Payer: Self-pay

## 2019-05-05 ENCOUNTER — Ambulatory Visit: Payer: Self-pay | Admitting: *Deleted

## 2019-05-05 ENCOUNTER — Emergency Department (HOSPITAL_COMMUNITY)
Admission: EM | Admit: 2019-05-05 | Discharge: 2019-05-05 | Disposition: A | Discharge status: Home / Self Care | Payer: 59 | Attending: Emergency Medicine | Admitting: Emergency Medicine

## 2019-05-05 DIAGNOSIS — Z79899 Other long term (current) drug therapy: Secondary | ICD-10-CM | POA: Insufficient documentation

## 2019-05-05 DIAGNOSIS — G8929 Other chronic pain: Secondary | ICD-10-CM | POA: Diagnosis not present

## 2019-05-05 DIAGNOSIS — R079 Chest pain, unspecified: Secondary | ICD-10-CM | POA: Diagnosis present

## 2019-05-05 DIAGNOSIS — M25512 Pain in left shoulder: Secondary | ICD-10-CM | POA: Diagnosis not present

## 2019-05-05 DIAGNOSIS — R0789 Other chest pain: Secondary | ICD-10-CM

## 2019-05-05 DIAGNOSIS — J45909 Unspecified asthma, uncomplicated: Secondary | ICD-10-CM | POA: Diagnosis not present

## 2019-05-05 LAB — BASIC METABOLIC PANEL
Anion gap: 11 (ref 5–15)
BUN: 13 mg/dL (ref 8–23)
CO2: 26 mmol/L (ref 22–32)
Calcium: 9.6 mg/dL (ref 8.9–10.3)
Chloride: 106 mmol/L (ref 98–111)
Creatinine, Ser: 0.68 mg/dL (ref 0.44–1.00)
GFR calc Af Amer: >60 mL/min (ref >60)
GFR calc non Af Amer: >60 mL/min (ref >60)
Glucose, Bld: 121 mg/dL — ABNORMAL HIGH (ref 70–99)
Potassium: 3.9 mmol/L (ref 3.5–5.1)
Sodium: 143 mmol/L (ref 135–145)

## 2019-05-05 LAB — CBC
HCT: 40.5 % (ref 36.0–46.0)
Hemoglobin: 13 g/dL (ref 12.0–15.0)
MCH: 26.8 pg (ref 26.0–34.0)
MCHC: 32.1 g/dL (ref 30.0–36.0)
MCV: 83.5 fL (ref 80.0–100.0)
Platelets: 253 10*3/uL (ref 150–400)
RBC: 4.85 MIL/uL (ref 3.87–5.11)
RDW: 14.4 % (ref 11.5–15.5)
WBC: 6.2 10*3/uL (ref 4.0–10.5)
nRBC: 0 % (ref 0.0–0.2)

## 2019-05-05 LAB — TROPONIN I (HIGH SENSITIVITY): Troponin I (High Sensitivity): <2 ng/L (ref <18)

## 2019-05-05 MED ORDER — SODIUM CHLORIDE 0.9% FLUSH: 3.0000 mL | Freq: Once | INTRAVENOUS | Status: DC

## 2019-05-05 NOTE — Telephone Encounter (Signed)
Pt called in c/o left sided chest pain with pain in her left shoulder and her left hand tingling.   She is also feeling "stabbing" pains in her left arm.   See triage notes.    I have referred her to the ED.   She's going to [facility].  She was agreeable to the care advice to go to the ED even though she said,   "I really don't want to go there".  I sent my notes to Dr. Cresenciano Lick office.    Reason for Disposition . Pain also in shoulder(s) or arm(s) or jaw  Answer Assessment - Initial Assessment Questions 1. LOCATION: "Where does it hurt?"       Left hand sore.   I lifted my left arm and it has a pain that began 2-3 days ago.   It comes comes and goes.   It feels like someone is stabbing me.   The pain is in the left side of my chest.   This is a new pain. 2. RADIATION: "Does the pain go anywhere else?" (e.g., into neck, jaw, arms, back)     It started in my back and radiated into my chest.   I had the pain in the left shoulder and my left neck.    3. ONSET: "When did the chest pain begin?" (Minutes, hours or days)      2-3 days ago 4. PATTERN "Does the pain come and go, or has it been constant since it started?"  "Does it get worse with exertion?"      Yes it comes and goes. 5. DURATION: "How long does it last" (e.g., seconds, minutes, hours)     It's like a quick stab and then it's gone. 6. SEVERITY: "How bad is the pain?"  (e.g., Scale 1-10; mild, moderate, or severe)    - MILD (1-3): doesn't interfere with normal activities     - MODERATE (4-7): interferes with normal activities or awakens from sleep    - SEVERE (8-10): excruciating pain, unable to do any normal activities       5 with the stabbing sensation. 7. CARDIAC RISK FACTORS: "Do you have any history of heart problems or risk factors for heart disease?" (e.g., angina, prior heart attack; diabetes, high blood pressure, high cholesterol, smoker, or strong family history of heart disease)     High  cholesterol. 8. PULMONARY RISK FACTORS: "Do you have any history of lung disease?"  (e.g., blood clots in lung, asthma, emphysema, birth control pills)     No      9. CAUSE: "What do you think is causing the chest pain?"     I thought it was my posture the way I was sitting in my chair.   My lower back the upper back and now in my chest.    I have tingling in my left hand.   10. OTHER SYMPTOMS: "Do you have any other symptoms?" (e.g., dizziness, nausea, vomiting, sweating, fever, difficulty breathing, cough)       No dizziness, I break out into sweats,  11. PREGNANCY: "Is there any chance you are pregnant?" "When was your last menstrual period?"       N/A due to age  Protocols used: CHEST PAIN-A-AH

## 2019-05-05 NOTE — ED Triage Notes (Addendum)
Patient c/o intermittent left chest pain and heartburn x 3 days. Patient denies any N/V, dizziness. Patient states she has had left upper back pain x several months and now the chest pain radiates into the left upper back.

## 2019-05-05 NOTE — Discharge Instructions (Addendum)
Please return for any problem.  Follow-up with your regular care provider as instructed. °

## 2019-05-05 NOTE — ED Notes (Signed)
EDP at bedside  

## 2019-05-05 NOTE — ED Provider Notes (Signed)
[facility] DEPT Provider Note   CSN: [id number] Arrival date & time: 05/05/19  1508     History   Chief Complaint Chief Complaint  Patient presents with  . Chest Pain  . Numbness    HPI Yvonne Marshall is a 63 y.o. female.     63 year old female with prior medical history as detailed below presents for evaluation of left shoulder and left sided chest wall pain.  Patient reports that her left shoulder has been bothering her for at least the last several months.  She is seen her regular care provider about this complaint multiple times.  She has been advised that her left shoulder pain is probably secondary to postural issues at work (she sits at a computer all day).  She has been given anti-inflammatories and antispasmodics for treatment of her left shoulder discomfort.  Over the last 4 days she has noticed mild intermittent sharp pain to the anterior left chest wall radiating from the left axilla.  She denies associated nausea, vomiting, shortness of breath, fever, or other complaint.  She denies any symptoms currently.  She reports of the sharp pain will occur spontaneously and last anywhere from 5 to 30 minutes.   The history is provided by the patient, medical records and the EMS personnel.  Chest Pain Pain location:  L lateral chest Pain quality: sharp and stabbing   Pain radiates to:  L shoulder Pain severity:  No pain Onset quality:  Sudden Duration:  3 days Timing:  Intermittent Progression:  Waxing and waning Chronicity:  New Relieved by:  Nothing Worsened by:  Nothing   Past Medical History:  Diagnosis Date  . Allergic rhinitis   . Anemia, mild   . Anxiety   . Asthma, mild   . Chest pain, atypical   . Degenerative joint disease   . GERD (gastroesophageal reflux disease)   . Hemorrhoids   . Hyperlipidemia   . IBS (irritable bowel syndrome)     Patient Active Problem List   Diagnosis Date Noted  . Vitamin D deficiency  10/25/2018  . IGT (impaired glucose tolerance) 10/25/2018  . Joint pain 12/14/2017  . Physical exam, annual 02/18/2014  . Anemia, mild   . ACUTE BRONCHITIS 07/20/2009  . GERD 01/04/2008  . Osteoarthritis 01/04/2008  . CHEST PAIN, ATYPICAL 01/02/2008  . Hypercholesterolemia 06/21/2007  . Anxiety state 06/21/2007  . Allergic rhinitis 06/21/2007  . IRRITABLE BOWEL SYNDROME 06/21/2007  . HEMORRHOIDS, HX OF 06/21/2007    Past Surgical History:  Procedure Laterality Date  . EXPLORATORY LAPAROTOMY     for adhesions  . FINGER SURGERY    . HYSTEROSCOPY  1998  . ROTATOR CUFF REPAIR Left 1990     OB History   No obstetric history on file.      Home Medications    Prior to Admission medications   Medication Sig Start Date End Date Taking? Authorizing Provider  Ascorbic Acid (VITAMIN C) 1000 MG tablet Take 1,000 mg by mouth daily.    [provider]  atorvastatin (LIPITOR) 10 MG tablet Take 1 tablet (10 mg total) by mouth daily. 04/17/19   Isaac Bliss, Rayford Halsted, MD  Cholecalciferol (VITAMIN D) 2000 UNITS tablet Take 1 tablet (2,000 Units total) by mouth daily. 04/01/14   Noralee Space, MD  ciprofloxacin (CIPRO) 500 MG tablet  02/12/19   [provider]  ferrous sulfate 325 (65 FE) MG tablet Take 325 mg by mouth daily with breakfast.  [provider]  methocarbamol (ROBAXIN) 500 MG tablet Take 1 tablet (500 mg total) by mouth every 8 (eight) hours as needed for muscle spasms. 04/21/19   Burchette, Alinda Sierras, MD  ondansetron (ZOFRAN-ODT) 8 MG disintegrating tablet  02/12/19   [provider]    Family History Family History  Problem Relation Age of Onset  . Alcohol abuse Father   . Cancer Other        sibling  . Breast cancer Maternal Grandmother        60's  . Heart failure Mother   . Diabetes Mother   . Colon cancer Neg Hx   . Esophageal cancer Neg Hx   . Rectal cancer Neg Hx   . Stomach cancer Neg Hx     Social History Social  History   Tobacco Use  . Smoking status: Never Smoker  . Smokeless tobacco: Never Used  Substance Use Topics  . Alcohol use: Yes    Alcohol/week: 3.0 - 4.0 standard drinks    Types: 3 - 4 Cans of beer per week  . Drug use: No     Allergies   Patient has no known allergies.   Review of Systems Review of Systems  Cardiovascular: Positive for chest pain.  All other systems reviewed and are negative.    Physical Exam Updated Vital Signs BP 128/90 (BP Location: Right Arm)   Pulse 71   Temp 98.5 F (36.9 C) (Oral)   Resp 18   Ht 5\' 6"  (1.676 m)   Wt 71.2 kg   SpO2 100%   BMI 25.34 kg/m   Physical Exam Vitals signs and nursing note reviewed.  Constitutional:      General: She is not in acute distress.    Appearance: She is well-developed.  HENT:     Head: Normocephalic and atraumatic.  Eyes:     Conjunctiva/sclera: Conjunctivae normal.     Pupils: Pupils are equal, round, and reactive to light.  Neck:     Musculoskeletal: Normal range of motion and neck supple.  Cardiovascular:     Rate and Rhythm: Normal rate and regular rhythm.     Heart sounds: Normal heart sounds.  Pulmonary:     Effort: Pulmonary effort is normal. No respiratory distress.     Breath sounds: Normal breath sounds.  Abdominal:     General: There is no distension.     Palpations: Abdomen is soft.     Tenderness: There is no abdominal tenderness.  Musculoskeletal: Normal range of motion.        General: No deformity.     Comments: Mild diffuse muscular tenderness to the left lateral superior aspect of the shoulder.  Skin:    General: Skin is warm and dry.  Neurological:     Mental Status: She is alert and oriented to person, place, and time.      ED Treatments / Results  Labs (all labs ordered are listed, but only abnormal results are displayed) Labs Reviewed  BASIC METABOLIC PANEL - Abnormal; Notable for the following components:      Result Value   Glucose, Bld 121 (*)    All  other components within normal limits  CBC  TROPONIN I (HIGH SENSITIVITY)  TROPONIN I (HIGH SENSITIVITY)    EKG EKG Interpretation  Date/Time:  Monday May 05 2019 15:21:17 EST Ventricular Rate:  64 PR Interval:    QRS Duration: 92 QT Interval:  388 QTC Calculation: 401 R Axis:   75 Text  Interpretation: Sinus rhythm Confirmed by Lennice Sites [phone]) on 05/05/2019 3:34:34 PM   Radiology Dg Chest 2 View  Result Date: 05/05/2019 CLINICAL DATA:  Chest pain. EXAM: CHEST - 2 VIEW COMPARISON:  06/29/2016. FINDINGS: Mediastinum and hilar structures normal. Lungs are clear. No pleural effusion or pneumothorax. Heart size normal. Degenerative change and scoliosis thoracic spine. IMPRESSION: No active cardiopulmonary disease. Electronically Signed   By: Marcello Moores  Register   On: 05/05/2019 16:06    Procedures Procedures (including critical care time)  Medications Ordered in ED Medications  sodium chloride flush (NS) 0.9 % injection 3 mL (0 mLs Intravenous Hold 05/05/19 1740)     Initial Impression / Assessment and Plan / ED Course  I have reviewed the triage vital signs and the nursing notes.  Pertinent labs & imaging results that were available during my care of the patient were reviewed by me and considered in my medical decision making (see chart for details).        MDM  Screen complete  Yvonne Marshall was evaluated in Emergency Department on 05/05/2019 for the symptoms described in the history of present illness. She was evaluated in the context of the global COVID-19 pandemic, which necessitated consideration that the patient might be at risk for infection with the SARS-CoV-2 virus that causes COVID-19. Institutional protocols and algorithms that pertain to the evaluation of patients at risk for COVID-19 are in a state of rapid change based on information released by regulatory bodies including the CDC and federal and state organizations. These policies and algorithms were  followed during the patient's care in the ED.  Patient is presenting for evaluation of reported left shoulder and left chest wall pain.  Presentation is not consistent with ACS.  EKG is without evidence of acute ischemia.  Troponin is less than 2.  Patient's symptoms are more consistent with likely radiculopathy with element of trapezial strain.  Patient does understand the need for close follow-up.  Strict return precautions given and understood.  Final Clinical Impressions(s) / ED Diagnoses   Final diagnoses:  Chronic left shoulder pain  Atypical chest pain    ED Discharge Orders    None       Valarie Merino, MD 05/05/19 [phone]

## 2019-08-05 ENCOUNTER — Telehealth: Payer: Self-pay | Admitting: Internal Medicine

## 2019-08-05 NOTE — Telephone Encounter (Signed)
Okay to refer? 

## 2019-08-05 NOTE — Telephone Encounter (Signed)
Pt would like a referral to an ENT She has been having sinus drainage for a few months, down her throat and her chest. Pt also stated she has been having back pain and when she talks sometimes she feels winded.  Pt can be reached at [phone] ASAP   She is scheduled for the covid vaccine this Sunday and would like to get the appt with the ENT before then if possible

## 2019-08-06 NOTE — Telephone Encounter (Signed)
Left detailed message on machine for patient to schedule an appointment.  This should be a virtual appointment.

## 2019-08-06 NOTE — Telephone Encounter (Signed)
Pt is scheduled for 7am on 3/11

## 2019-08-07 ENCOUNTER — Telehealth: Payer: 59 | Admitting: Family

## 2019-12-30 ENCOUNTER — Other Ambulatory Visit: Payer: 59

## 2020-02-12 ENCOUNTER — Telehealth: Payer: 59 | Admitting: Internal Medicine

## 2020-09-17 ENCOUNTER — Other Ambulatory Visit: Payer: Self-pay

## 2020-09-17 ENCOUNTER — Other Ambulatory Visit: Payer: Self-pay | Admitting: Internal Medicine

## 2020-09-17 ENCOUNTER — Encounter: Payer: Self-pay | Admitting: Internal Medicine

## 2020-09-17 ENCOUNTER — Ambulatory Visit (INDEPENDENT_AMBULATORY_CARE_PROVIDER_SITE_OTHER): Payer: 59 | Admitting: Internal Medicine

## 2020-09-17 VITALS — BP 110/80 | HR 68 | Temp 98.0°F | Ht 65.0 in | Wt 147.4 lb

## 2020-09-17 DIAGNOSIS — E559 Vitamin D deficiency, unspecified: Secondary | ICD-10-CM | POA: Diagnosis not present

## 2020-09-17 DIAGNOSIS — R7302 Impaired glucose tolerance (oral): Secondary | ICD-10-CM | POA: Diagnosis not present

## 2020-09-17 DIAGNOSIS — E7800 Pure hypercholesterolemia, unspecified: Secondary | ICD-10-CM | POA: Diagnosis not present

## 2020-09-17 DIAGNOSIS — Z13820 Encounter for screening for osteoporosis: Secondary | ICD-10-CM

## 2020-09-17 DIAGNOSIS — Z0000 Encounter for general adult medical examination without abnormal findings: Secondary | ICD-10-CM

## 2020-09-17 DIAGNOSIS — Z780 Asymptomatic menopausal state: Secondary | ICD-10-CM

## 2020-09-17 DIAGNOSIS — Z23 Encounter for immunization: Secondary | ICD-10-CM | POA: Diagnosis not present

## 2020-09-17 DIAGNOSIS — Z124 Encounter for screening for malignant neoplasm of cervix: Secondary | ICD-10-CM

## 2020-09-17 DIAGNOSIS — Z1239 Encounter for other screening for malignant neoplasm of breast: Secondary | ICD-10-CM

## 2020-09-17 LAB — COMPREHENSIVE METABOLIC PANEL
ALT: 9 U/L (ref 0–35)
AST: 15 U/L (ref 0–37)
Albumin: 4.1 g/dL (ref 3.5–5.2)
Alkaline Phosphatase: 75 U/L (ref 39–117)
BUN: 11 mg/dL (ref 6–23)
CO2: 30 mEq/L (ref 19–32)
Calcium: 9.5 mg/dL (ref 8.4–10.5)
Chloride: 106 mEq/L (ref 96–112)
Creatinine, Ser: 0.74 mg/dL (ref 0.40–1.20)
GFR: 86.68 mL/min (ref >60.00)
Glucose, Bld: 100 mg/dL — ABNORMAL HIGH (ref 70–99)
Potassium: 4.3 mEq/L (ref 3.5–5.1)
Sodium: 143 mEq/L (ref 135–145)
Total Bilirubin: 0.5 mg/dL (ref 0.2–1.2)
Total Protein: 6.6 g/dL (ref 6.0–8.3)

## 2020-09-17 LAB — CBC WITH DIFFERENTIAL/PLATELET
Basophils Absolute: 0 10*3/uL (ref 0.0–0.1)
Basophils Relative: 0.7 % (ref 0.0–3.0)
Eosinophils Absolute: 0 10*3/uL (ref 0.0–0.7)
Eosinophils Relative: 1 % (ref 0.0–5.0)
HCT: 38.2 % (ref 36.0–46.0)
Hemoglobin: 12.4 g/dL (ref 12.0–15.0)
Lymphocytes Relative: 38.1 % (ref 12.0–46.0)
Lymphs Abs: 1.6 10*3/uL (ref 0.7–4.0)
MCHC: 32.5 g/dL (ref 30.0–36.0)
MCV: 81.6 fl (ref 78.0–100.0)
Monocytes Absolute: 0.2 10*3/uL (ref 0.1–1.0)
Monocytes Relative: 5.8 % (ref 3.0–12.0)
Neutro Abs: 2.3 10*3/uL (ref 1.4–7.7)
Neutrophils Relative %: 54.4 % (ref 43.0–77.0)
Platelets: 234 10*3/uL (ref 150.0–400.0)
RBC: 4.68 Mil/uL (ref 3.87–5.11)
RDW: 15 % (ref 11.5–15.5)
WBC: 4.2 10*3/uL (ref 4.0–10.5)

## 2020-09-17 LAB — LIPID PANEL
Cholesterol: 229 mg/dL — ABNORMAL HIGH (ref 0–200)
HDL: 92.6 mg/dL (ref >39.00)
LDL Cholesterol: 127 mg/dL — ABNORMAL HIGH (ref 0–99)
NonHDL: 135.94
Total CHOL/HDL Ratio: 2
Triglycerides: 45 mg/dL (ref 0.0–149.0)
VLDL: 9 mg/dL (ref 0.0–40.0)

## 2020-09-17 LAB — VITAMIN B12: Vitamin B-12: 215 pg/mL (ref 211–911)

## 2020-09-17 LAB — TSH: TSH: 1.12 u[IU]/mL (ref 0.35–4.50)

## 2020-09-17 LAB — VITAMIN D 25 HYDROXY (VIT D DEFICIENCY, FRACTURES): VITD: 21.53 ng/mL — ABNORMAL LOW (ref 30.00–100.00)

## 2020-09-17 LAB — HEMOGLOBIN A1C: Hgb A1c MFr Bld: 6.5 % (ref 4.6–6.5)

## 2020-09-17 MED ORDER — VITAMIN D (ERGOCALCIFEROL) 1.25 MG (50000 UNIT) PO CAPS: 50000.0000 [IU] | ORAL_CAPSULE | ORAL | 0 refills | Status: DC

## 2020-09-17 NOTE — Addendum Note (Signed)
Addended by: Westley Hummer B on: 09/17/2020 10:09 AM   Modules accepted: Orders

## 2020-09-17 NOTE — Progress Notes (Signed)
Established Patient Office Visit     This visit occurred during the SARS-CoV-2 public health emergency.  Safety protocols were in place, including screening questions prior to the visit, additional usage of staff PPE, and extensive cleaning of exam room while observing appropriate contact time as indicated for disinfecting solutions.    CC/Reason for Visit: Annual preventive exam  HPI: Yvonne Marshall is a 65 y.o. female who is coming in today for the above mentioned reasons. Past Medical History is significant for: Hyperlipidemia, GERD, impaired glucose tolerance and vitamin D deficiency.  She has no acute complaints today.  She does not have routine eye or dental care.  She is overdue for cervical and breast cancer screening.  She had a colonoscopy in 2014.  She is due for her Tdap and her fourth COVID vaccination.   Past Medical/Surgical History: Past Medical History:  Diagnosis Date  . Allergic rhinitis   . Anemia, mild   . Anxiety   . Asthma, mild   . Chest pain, atypical   . Degenerative joint disease   . GERD (gastroesophageal reflux disease)   . Hemorrhoids   . Hyperlipidemia   . IBS (irritable bowel syndrome)     Past Surgical History:  Procedure Laterality Date  . EXPLORATORY LAPAROTOMY     for adhesions  . FINGER SURGERY    . HYSTEROSCOPY  1998  . ROTATOR CUFF REPAIR Left 1990    Social History:  reports that she has never smoked. She has never used smokeless tobacco. She reports current alcohol use of about 3.0 - 4.0 standard drinks of alcohol per week. She reports that she does not use drugs.  Allergies: No Known Allergies  Family History:  Family History  Problem Relation Age of Onset  . Alcohol abuse Father   . Cancer Other        sibling  . Breast cancer Maternal Grandmother        60's  . Heart failure Mother   . Diabetes Mother   . Colon cancer Neg Hx   . Esophageal cancer Neg Hx   . Rectal cancer Neg Hx   . Stomach cancer Neg Hx       Current Outpatient Medications:  .  Ascorbic Acid (VITAMIN C) 1000 MG tablet, Take 1,000 mg by mouth daily., Disp: , Rfl:  .  atorvastatin (LIPITOR) 10 MG tablet, Take 1 tablet (10 mg total) by mouth daily., Disp: 90 tablet, Rfl: 1 .  Cholecalciferol (VITAMIN D) 2000 UNITS tablet, Take 1 tablet (2,000 Units total) by mouth daily., Disp: 90 tablet, Rfl: 1 .  ferrous sulfate 325 (65 FE) MG tablet, Take 325 mg by mouth daily with breakfast., Disp: , Rfl:  .  methocarbamol (ROBAXIN) 500 MG tablet, Take 1 tablet (500 mg total) by mouth every 8 (eight) hours as needed for muscle spasms., Disp: 20 tablet, Rfl: 0 .  ondansetron (ZOFRAN-ODT) 8 MG disintegrating tablet, , Disp: , Rfl:   Review of Systems:  Constitutional: Denies fever, chills, diaphoresis, appetite change and fatigue.  HEENT: Denies photophobia, eye pain, redness, hearing loss, ear pain, congestion, sore throat, rhinorrhea, sneezing, mouth sores, trouble swallowing, neck pain, neck stiffness and tinnitus.   Respiratory: Denies SOB, DOE, cough, chest tightness,  and wheezing.   Cardiovascular: Denies chest pain, palpitations and leg swelling.  Gastrointestinal: Denies nausea, vomiting, abdominal pain, diarrhea, constipation, blood in stool and abdominal distention.  Genitourinary: Denies dysuria, urgency, frequency, hematuria, flank pain and difficulty urinating.  Endocrine: Denies: hot or cold intolerance, sweats, changes in hair or nails, polyuria, polydipsia. Musculoskeletal: Denies myalgias, back pain, joint swelling, arthralgias and gait problem.  Skin: Denies pallor, rash and wound.  Neurological: Denies dizziness, seizures, syncope, weakness, light-headedness, numbness and headaches.  Hematological: Denies adenopathy. Easy bruising, personal or family bleeding history  Psychiatric/Behavioral: Denies suicidal ideation, mood changes, confusion, nervousness, sleep disturbance and agitation    Physical Exam: Vitals:    09/17/20 0929  BP: 110/80  Pulse: 68  Temp: 98 F (36.7 C)  TempSrc: Oral  SpO2: 99%  Weight: 147 lb 6.4 oz (66.9 kg)  Height: 5' 5"  (1.651 m)    Body mass index is 24.53 kg/m.   Constitutional: NAD, calm, comfortable Eyes: PERRL, lids and conjunctivae normal ENMT: Mucous membranes are moist. Posterior pharynx clear of any exudate or lesions. Normal dentition. Tympanic membrane is pearly white, no erythema or bulging. Neck: normal, supple, no masses, no thyromegaly Respiratory: clear to auscultation bilaterally, no wheezing, no crackles. Normal respiratory effort. No accessory muscle use.  Cardiovascular: Regular rate and rhythm, no murmurs / rubs / gallops. No extremity edema. 2+ pedal pulses. No carotid bruits.  Abdomen: no tenderness, no masses palpated. No hepatosplenomegaly. Bowel sounds positive.  Musculoskeletal: no clubbing / cyanosis. No joint deformity upper and lower extremities. Good ROM, no contractures. Normal muscle tone.  Skin: no rashes, lesions, ulcers. No induration Neurologic: CN 2-12 grossly intact. Sensation intact, DTR normal. Strength 5/5 in all 4.  Psychiatric: Normal judgment and insight. Alert and oriented x 3. Normal mood.    Impression and Plan:  Encounter for preventive health examination -Advised routine eye and dental care. -Tdap in office today, she will get fourth COVID-vaccine at her pharmacy.  Otherwise immunizations are up-to-date. -Screening labs today. -Healthy lifestyle discussed in detail. -She had a colonoscopy in 2014 and is a 10-year callback. -She is overdue for mammogram, requested today. -She will be referred to GYN for cervical cancer screening per her preference.  Screening for cervical cancer  - Plan: Ambulatory referral to Obstetrics / Gynecology  Vitamin D deficiency  - Plan: VITAMIN D 25 Hydroxy (Vit-D Deficiency, Fractures)  IGT (impaired glucose tolerance)  - Plan: Hemoglobin A1c  Hypercholesterolemia -Check  lipids today. -Continue atorvastatin 10 mg daily.  Screening breast examination  - Plan: MM Digital Screening  Need for Tdap vaccination -Tdap administered today.   Patient Instructions   -Nice seeing you today!!  -Lab work today; will notify you once results are available.  -Tetanus vaccine today.  -Remember your fourth COVID vaccine.  -Remember to schedule your eye and dental exams.  -Schedule follow up in 1 year or sooner as needed.   Preventive Care 24-39 Years Old, Female Preventive care refers to lifestyle choices and visits with your health care provider that can promote health and wellness. This includes:  A yearly physical exam. This is also called an annual wellness visit.  Regular dental and eye exams.  Immunizations.  Screening for certain conditions.  Healthy lifestyle choices, such as: ? Eating a healthy diet. ? Getting regular exercise. ? Not using drugs or products that contain nicotine and tobacco. ? Limiting alcohol use. What can I expect for my preventive care visit? Physical exam Your health care provider will check your:  Height and weight. These may be used to calculate your BMI (body mass index). BMI is a measurement that tells if you are at a healthy weight.  Heart rate and blood pressure.  Body temperature.  Skin  for abnormal spots. Counseling Your health care provider may ask you questions about your:  Past medical problems.  Family's medical history.  Alcohol, tobacco, and drug use.  Emotional well-being.  Home life and relationship well-being.  Sexual activity.  Diet, exercise, and sleep habits.  Work and work Statistician.  Access to firearms.  Method of birth control.  Menstrual cycle.  Pregnancy history. What immunizations do I need? Vaccines are usually given at various ages, according to a schedule. Your health care provider will recommend vaccines for you based on your age, medical history, and lifestyle  or other factors, such as travel or where you work.   What tests do I need? Blood tests  Lipid and cholesterol levels. These may be checked every 5 years, or more often if you are over 74 years old.  Hepatitis C test.  Hepatitis B test. Screening  Lung cancer screening. You may have this screening every year starting at age 72 if you have a 30-pack-year history of smoking and currently smoke or have quit within the past 15 years.  Colorectal cancer screening. ? All adults should have this screening starting at age 28 and continuing until age 35. ? Your health care provider may recommend screening at age 18 if you are at increased risk. ? You will have tests every 1-10 years, depending on your results and the type of screening test.  Diabetes screening. ? This is done by checking your blood sugar (glucose) after you have not eaten for a while (fasting). ? You may have this done every 1-3 years.  Mammogram. ? This may be done every 1-2 years. ? Talk with your health care provider about when you should start having regular mammograms. This may depend on whether you have a family history of breast cancer.  BRCA-related cancer screening. This may be done if you have a family history of breast, ovarian, tubal, or peritoneal cancers.  Pelvic exam and Pap test. ? This may be done every 3 years starting at age 22. ? Starting at age 10, this may be done every 5 years if you have a Pap test in combination with an HPV test. Other tests  STD (sexually transmitted disease) testing, if you are at risk.  Bone density scan. This is done to screen for osteoporosis. You may have this scan if you are at high risk for osteoporosis. Talk with your health care provider about your test results, treatment options, and if necessary, the need for more tests. Follow these instructions at home: Eating and drinking  Eat a diet that includes fresh fruits and vegetables, whole grains, lean protein, and  low-fat dairy products.  Take vitamin and mineral supplements as recommended by your health care provider.  Do not drink alcohol if: ? Your health care provider tells you not to drink. ? You are pregnant, may be pregnant, or are planning to become pregnant.  If you drink alcohol: ? Limit how much you have to 0-1 drink a day. ? Be aware of how much alcohol is in your drink. In the [place] one drink equals one 12 oz bottle of beer (355 mL), one 5 oz glass of wine (148 mL), or one 1 oz glass of hard liquor (44 mL).   Lifestyle  Take daily care of your teeth and gums. Brush your teeth every morning and night with fluoride toothpaste. Floss one time each day.  Stay active. Exercise for at least 30 minutes 5 or more days each week.  Do  not use any products that contain nicotine or tobacco, such as cigarettes, e-cigarettes, and chewing tobacco. If you need help quitting, ask your health care provider.  Do not use drugs.  If you are sexually active, practice safe sex. Use a condom or other form of protection to prevent STIs (sexually transmitted infections).  If you do not wish to become pregnant, use a form of birth control. If you plan to become pregnant, see your health care provider for a prepregnancy visit.  If told by your health care provider, take low-dose aspirin daily starting at age 46.  Find healthy ways to cope with stress, such as: ? Meditation, yoga, or listening to music. ? Journaling. ? Talking to a trusted person. ? Spending time with friends and family. Safety  Always wear your seat belt while driving or riding in a vehicle.  Do not drive: ? If you have been drinking alcohol. Do not ride with someone who has been drinking. ? When you are tired or distracted. ? While texting.  Wear a helmet and other protective equipment during sports activities.  If you have firearms in your house, make sure you follow all gun safety procedures. What's next?  Visit your health  care provider once a year for an annual wellness visit.  Ask your health care provider how often you should have your eyes and teeth checked.  Stay up to date on all vaccines. This information is not intended to replace advice given to you by your health care provider. Make sure you discuss any questions you have with your health care provider. Document Revised: 02/17/2020 Document Reviewed: 01/24/2018 Elsevier Patient Education  2021 [place], MD [facility]

## 2020-09-17 NOTE — Patient Instructions (Signed)
-Nice seeing you today!!  -Lab work today; will notify you once results are available.  -Tetanus vaccine today.  -Remember your fourth COVID vaccine.  -Remember to schedule your eye and dental exams.  -Schedule follow up in 1 year or sooner as needed.   Preventive Care 3-63 Years Old, Female Preventive care refers to lifestyle choices and visits with your health care provider that can promote health and wellness. This includes:  A yearly physical exam. This is also called an annual wellness visit.  Regular dental and eye exams.  Immunizations.  Screening for certain conditions.  Healthy lifestyle choices, such as: ? Eating a healthy diet. ? Getting regular exercise. ? Not using drugs or products that contain nicotine and tobacco. ? Limiting alcohol use. What can I expect for my preventive care visit? Physical exam Your health care provider will check your:  Height and weight. These may be used to calculate your BMI (body mass index). BMI is a measurement that tells if you are at a healthy weight.  Heart rate and blood pressure.  Body temperature.  Skin for abnormal spots. Counseling Your health care provider may ask you questions about your:  Past medical problems.  Family's medical history.  Alcohol, tobacco, and drug use.  Emotional well-being.  Home life and relationship well-being.  Sexual activity.  Diet, exercise, and sleep habits.  Work and work Statistician.  Access to firearms.  Method of birth control.  Menstrual cycle.  Pregnancy history. What immunizations do I need? Vaccines are usually given at various ages, according to a schedule. Your health care provider will recommend vaccines for you based on your age, medical history, and lifestyle or other factors, such as travel or where you work.   What tests do I need? Blood tests  Lipid and cholesterol levels. These may be checked every 5 years, or more often if you are over 40 years  old.  Hepatitis C test.  Hepatitis B test. Screening  Lung cancer screening. You may have this screening every year starting at age 60 if you have a 30-pack-year history of smoking and currently smoke or have quit within the past 15 years.  Colorectal cancer screening. ? All adults should have this screening starting at age 48 and continuing until age 64. ? Your health care provider may recommend screening at age 58 if you are at increased risk. ? You will have tests every 1-10 years, depending on your results and the type of screening test.  Diabetes screening. ? This is done by checking your blood sugar (glucose) after you have not eaten for a while (fasting). ? You may have this done every 1-3 years.  Mammogram. ? This may be done every 1-2 years. ? Talk with your health care provider about when you should start having regular mammograms. This may depend on whether you have a family history of breast cancer.  BRCA-related cancer screening. This may be done if you have a family history of breast, ovarian, tubal, or peritoneal cancers.  Pelvic exam and Pap test. ? This may be done every 3 years starting at age 33. ? Starting at age 42, this may be done every 5 years if you have a Pap test in combination with an HPV test. Other tests  STD (sexually transmitted disease) testing, if you are at risk.  Bone density scan. This is done to screen for osteoporosis. You may have this scan if you are at high risk for osteoporosis. Talk with your health care  provider about your test results, treatment options, and if necessary, the need for more tests. Follow these instructions at home: Eating and drinking  Eat a diet that includes fresh fruits and vegetables, whole grains, lean protein, and low-fat dairy products.  Take vitamin and mineral supplements as recommended by your health care provider.  Do not drink alcohol if: ? Your health care provider tells you not to drink. ? You are  pregnant, may be pregnant, or are planning to become pregnant.  If you drink alcohol: ? Limit how much you have to 0-1 drink a day. ? Be aware of how much alcohol is in your drink. In the [place] one drink equals one 12 oz bottle of beer (355 mL), one 5 oz glass of wine (148 mL), or one 1 oz glass of hard liquor (44 mL).   Lifestyle  Take daily care of your teeth and gums. Brush your teeth every morning and night with fluoride toothpaste. Floss one time each day.  Stay active. Exercise for at least 30 minutes 5 or more days each week.  Do not use any products that contain nicotine or tobacco, such as cigarettes, e-cigarettes, and chewing tobacco. If you need help quitting, ask your health care provider.  Do not use drugs.  If you are sexually active, practice safe sex. Use a condom or other form of protection to prevent STIs (sexually transmitted infections).  If you do not wish to become pregnant, use a form of birth control. If you plan to become pregnant, see your health care provider for a prepregnancy visit.  If told by your health care provider, take low-dose aspirin daily starting at age 87.  Find healthy ways to cope with stress, such as: ? Meditation, yoga, or listening to music. ? Journaling. ? Talking to a trusted person. ? Spending time with friends and family. Safety  Always wear your seat belt while driving or riding in a vehicle.  Do not drive: ? If you have been drinking alcohol. Do not ride with someone who has been drinking. ? When you are tired or distracted. ? While texting.  Wear a helmet and other protective equipment during sports activities.  If you have firearms in your house, make sure you follow all gun safety procedures. What's next?  Visit your health care provider once a year for an annual wellness visit.  Ask your health care provider how often you should have your eyes and teeth checked.  Stay up to date on all vaccines. This information is  not intended to replace advice given to you by your health care provider. Make sure you discuss any questions you have with your health care provider. Document Revised: 02/17/2020 Document Reviewed: 01/24/2018 Elsevier Patient Education  2021 [employer].

## 2020-09-21 ENCOUNTER — Telehealth: Payer: Self-pay | Admitting: Internal Medicine

## 2020-09-21 ENCOUNTER — Other Ambulatory Visit: Payer: Self-pay | Admitting: Internal Medicine

## 2020-09-21 DIAGNOSIS — E559 Vitamin D deficiency, unspecified: Secondary | ICD-10-CM

## 2020-09-21 MED ORDER — VITAMIN D (ERGOCALCIFEROL) 1.25 MG (50000 UNIT) PO CAPS: 50000.0000 [IU] | ORAL_CAPSULE | ORAL | 0 refills | Status: AC

## 2020-09-21 NOTE — Telephone Encounter (Signed)
Patient is calling and wanted to see if her Vitamin D be transferred from [facility] to [facility] on [address], please advise. CB is [phone]

## 2020-10-18 ENCOUNTER — Encounter: Payer: Self-pay | Admitting: Nurse Practitioner

## 2020-12-01 ENCOUNTER — Other Ambulatory Visit: Payer: Self-pay

## 2020-12-01 ENCOUNTER — Ambulatory Visit
Admission: RE | Admit: 2020-12-01 | Discharge: 2020-12-01 | Disposition: A | Discharge status: Home / Self Care | Payer: 59 | Source: Ambulatory Visit | Attending: Internal Medicine | Admitting: Internal Medicine

## 2020-12-01 DIAGNOSIS — Z1239 Encounter for other screening for malignant neoplasm of breast: Secondary | ICD-10-CM

## 2020-12-07 ENCOUNTER — Other Ambulatory Visit: Payer: Self-pay | Admitting: Internal Medicine

## 2020-12-07 DIAGNOSIS — E559 Vitamin D deficiency, unspecified: Secondary | ICD-10-CM

## 2021-03-07 ENCOUNTER — Other Ambulatory Visit: Payer: Self-pay

## 2021-03-08 ENCOUNTER — Ambulatory Visit: Payer: 59 | Admitting: Family Medicine

## 2021-03-22 ENCOUNTER — Ambulatory Visit: Payer: 59 | Admitting: Family Medicine

## 2021-03-23 ENCOUNTER — Ambulatory Visit: Payer: Self-pay | Admitting: Internal Medicine

## 2021-03-23 ENCOUNTER — Ambulatory Visit (INDEPENDENT_AMBULATORY_CARE_PROVIDER_SITE_OTHER): Payer: 59 | Admitting: Internal Medicine

## 2021-03-23 ENCOUNTER — Other Ambulatory Visit: Payer: Self-pay

## 2021-03-23 ENCOUNTER — Encounter: Payer: Self-pay | Admitting: Internal Medicine

## 2021-03-23 VITALS — BP 104/66 | HR 64 | Temp 98.5°F | Ht 65.0 in | Wt 151.6 lb

## 2021-03-23 DIAGNOSIS — G5711 Meralgia paresthetica, right lower limb: Secondary | ICD-10-CM

## 2021-03-23 DIAGNOSIS — Z23 Encounter for immunization: Secondary | ICD-10-CM | POA: Diagnosis not present

## 2021-03-23 DIAGNOSIS — R7302 Impaired glucose tolerance (oral): Secondary | ICD-10-CM | POA: Diagnosis not present

## 2021-03-23 DIAGNOSIS — E7800 Pure hypercholesterolemia, unspecified: Secondary | ICD-10-CM

## 2021-03-23 LAB — POCT GLYCOSYLATED HEMOGLOBIN (HGB A1C): Hemoglobin A1C: 6.3 % — AB (ref 4.0–5.6)

## 2021-03-23 NOTE — Patient Instructions (Signed)
-  Nice seeing you today!!  -Schedule follow up in 6 months for you physical.  -Flu vaccine today.

## 2021-03-23 NOTE — Progress Notes (Signed)
Established Patient Office Visit     This visit occurred during the SARS-CoV-2 public health emergency.  Safety protocols were in place, including screening questions prior to the visit, additional usage of staff PPE, and extensive cleaning of exam room while observing appropriate contact time as indicated for disinfecting solutions.    CC/Reason for Visit: 64-month follow-up chronic conditions, thigh pain and numbness  HPI: Yvonne Marshall is a 65 y.o. female who is coming in today for the above mentioned reasons. Past Medical History is significant for: Impaired glucose tolerance, hyperlipidemia, GERD, vitamin D deficiency.  She has been feeling relatively well.  She is requesting a flu vaccine today.  For a few weeks time she has been complaining of pain and numbness to the upper, outer thigh area.   Past Medical/Surgical History: Past Medical History:  Diagnosis Date   Allergic rhinitis    Anemia, mild    Anxiety    Asthma, mild    Chest pain, atypical    Degenerative joint disease    GERD (gastroesophageal reflux disease)    Hemorrhoids    Hyperlipidemia    IBS (irritable bowel syndrome)     Past Surgical History:  Procedure Laterality Date   EXPLORATORY LAPAROTOMY     for adhesions   FINGER SURGERY     HYSTEROSCOPY  1998   ROTATOR CUFF REPAIR Left 1990    Social History:  reports that she has never smoked. She has never used smokeless tobacco. She reports current alcohol use of about 3.0 - 4.0 standard drinks per week. She reports that she does not use drugs.  Allergies: No Known Allergies  Family History:  Family History  Problem Relation Age of Onset   Alcohol abuse Father    Cancer Other        sibling   Breast cancer Maternal Grandmother        60's   Heart failure Mother    Diabetes Mother    Colon cancer Neg Hx    Esophageal cancer Neg Hx    Rectal cancer Neg Hx    Stomach cancer Neg Hx      Current Outpatient Medications:    Ascorbic  Acid (VITAMIN C) 1000 MG tablet, Take 1,000 mg by mouth daily., Disp: , Rfl:    atorvastatin (LIPITOR) 10 MG tablet, Take 1 tablet (10 mg total) by mouth daily., Disp: 90 tablet, Rfl: 1   Cholecalciferol (VITAMIN D) 2000 UNITS tablet, Take 1 tablet (2,000 Units total) by mouth daily., Disp: 90 tablet, Rfl: 1   ferrous sulfate 325 (65 FE) MG tablet, Take 325 mg by mouth daily with breakfast., Disp: , Rfl:    methocarbamol (ROBAXIN) 500 MG tablet, Take 1 tablet (500 mg total) by mouth every 8 (eight) hours as needed for muscle spasms., Disp: 20 tablet, Rfl: 0   ondansetron (ZOFRAN-ODT) 8 MG disintegrating tablet, , Disp: , Rfl:   Review of Systems:  Constitutional: Denies fever, chills, diaphoresis, appetite change and fatigue.  HEENT: Denies photophobia, eye pain, redness, hearing loss, ear pain, congestion, sore throat, rhinorrhea, sneezing, mouth sores, trouble swallowing, neck pain, neck stiffness and tinnitus.   Respiratory: Denies SOB, DOE, cough, chest tightness,  and wheezing.   Cardiovascular: Denies chest pain, palpitations and leg swelling.  Gastrointestinal: Denies nausea, vomiting, abdominal pain, diarrhea, constipation, blood in stool and abdominal distention.  Genitourinary: Denies dysuria, urgency, frequency, hematuria, flank pain and difficulty urinating.  Endocrine: Denies: hot or cold intolerance, sweats, changes in  hair or nails, polyuria, polydipsia. Musculoskeletal: Denies myalgias, back pain, joint swelling, arthralgias and gait problem.  Skin: Denies pallor, rash and wound.  Neurological: Denies dizziness, seizures, syncope, weakness, light-headedness and headaches.  Hematological: Denies adenopathy. Easy bruising, personal or family bleeding history  Psychiatric/Behavioral: Denies suicidal ideation, mood changes, confusion, nervousness, sleep disturbance and agitation    Physical Exam: Vitals:   03/23/21 1301  BP: 104/66  Pulse: 64  Temp: 98.5 F (36.9 C)   TempSrc: Oral  SpO2: 99%  Weight: 151 lb 9.6 oz (68.8 kg)  Height: 5\' 5"  (1.651 m)    Body mass index is 25.23 kg/m.   Constitutional: NAD, calm, comfortable Eyes: PERRL, lids and conjunctivae normal ENMT: Mucous membranes are moist.  Respiratory: clear to auscultation bilaterally, no wheezing, no crackles. Normal respiratory effort. No accessory muscle use.  Cardiovascular: Regular rate and rhythm, no murmurs / rubs / gallops. No extremity edema.  Psychiatric: Normal judgment and insight. Alert and oriented x 3. Normal mood.    Impression and Plan:  Hypercholesterolemia  -On atorvastatin 10 mg daily, recheck lipids with next visit.  Need for immunization against influenza  - Plan: Flu Vaccine QUAD 6+ mos PF IM (Fluarix Quad PF)  IGT (impaired glucose tolerance) -A1c is 6.3 today continue to follow.  Meralgia paresthetica of right side -Watch for, advised to wear looser clothing, belts, some hip flexor exercises given.  Time spent: 32 minutes reviewing chart, interviewing and examining patient and formulating plan of care.   Patient Instructions  -Nice seeing you today!!  -Schedule follow up in 6 months for you physical.  -Flu vaccine today.      Lelon Frohlich, MD [facility] at [facility]

## 2021-11-16 ENCOUNTER — Ambulatory Visit: Payer: 59 | Admitting: Podiatry

## 2021-12-15 ENCOUNTER — Encounter: Payer: 59 | Admitting: Internal Medicine

## 2022-01-11 ENCOUNTER — Encounter: Payer: Self-pay | Admitting: Internal Medicine

## 2022-01-11 ENCOUNTER — Ambulatory Visit (INDEPENDENT_AMBULATORY_CARE_PROVIDER_SITE_OTHER): Payer: 59 | Admitting: Internal Medicine

## 2022-01-11 VITALS — BP 110/80 | HR 64 | Temp 97.7°F | Ht 65.5 in | Wt 149.0 lb

## 2022-01-11 DIAGNOSIS — H539 Unspecified visual disturbance: Secondary | ICD-10-CM

## 2022-01-11 DIAGNOSIS — Z0000 Encounter for general adult medical examination without abnormal findings: Secondary | ICD-10-CM | POA: Diagnosis not present

## 2022-01-11 DIAGNOSIS — E7800 Pure hypercholesterolemia, unspecified: Secondary | ICD-10-CM

## 2022-01-11 DIAGNOSIS — Z124 Encounter for screening for malignant neoplasm of cervix: Secondary | ICD-10-CM | POA: Diagnosis not present

## 2022-01-11 DIAGNOSIS — R7302 Impaired glucose tolerance (oral): Secondary | ICD-10-CM | POA: Diagnosis not present

## 2022-01-11 DIAGNOSIS — E559 Vitamin D deficiency, unspecified: Secondary | ICD-10-CM | POA: Diagnosis not present

## 2022-01-11 LAB — CBC WITH DIFFERENTIAL/PLATELET
Basophils Absolute: 0 10*3/uL (ref 0.0–0.1)
Basophils Relative: 0.4 % (ref 0.0–3.0)
Eosinophils Absolute: 0 10*3/uL (ref 0.0–0.7)
Eosinophils Relative: 0.9 % (ref 0.0–5.0)
HCT: 38.8 % (ref 36.0–46.0)
Hemoglobin: 12.7 g/dL (ref 12.0–15.0)
Lymphocytes Relative: 34.2 % (ref 12.0–46.0)
Lymphs Abs: 1.7 10*3/uL (ref 0.7–4.0)
MCHC: 32.7 g/dL (ref 30.0–36.0)
MCV: 81.8 fl (ref 78.0–100.0)
Monocytes Absolute: 0.3 10*3/uL (ref 0.1–1.0)
Monocytes Relative: 5.5 % (ref 3.0–12.0)
Neutro Abs: 2.9 10*3/uL (ref 1.4–7.7)
Neutrophils Relative %: 59 % (ref 43.0–77.0)
Platelets: 219 10*3/uL (ref 150.0–400.0)
RBC: 4.75 Mil/uL (ref 3.87–5.11)
RDW: 15 % (ref 11.5–15.5)
WBC: 4.9 10*3/uL (ref 4.0–10.5)

## 2022-01-11 LAB — COMPREHENSIVE METABOLIC PANEL
ALT: 12 U/L (ref 0–35)
AST: 18 U/L (ref 0–37)
Albumin: 4.5 g/dL (ref 3.5–5.2)
Alkaline Phosphatase: 76 U/L (ref 39–117)
BUN: 12 mg/dL (ref 6–23)
CO2: 29 mEq/L (ref 19–32)
Calcium: 9.6 mg/dL (ref 8.4–10.5)
Chloride: 104 mEq/L (ref 96–112)
Creatinine, Ser: 0.7 mg/dL (ref 0.40–1.20)
GFR: 91.81 mL/min (ref >60.00)
Glucose, Bld: 79 mg/dL (ref 70–99)
Potassium: 3.7 mEq/L (ref 3.5–5.1)
Sodium: 140 mEq/L (ref 135–145)
Total Bilirubin: 0.7 mg/dL (ref 0.2–1.2)
Total Protein: 7.2 g/dL (ref 6.0–8.3)

## 2022-01-11 LAB — LIPID PANEL
Cholesterol: 262 mg/dL — ABNORMAL HIGH (ref 0–200)
HDL: 101.2 mg/dL (ref >39.00)
LDL Cholesterol: 152 mg/dL — ABNORMAL HIGH (ref 0–99)
NonHDL: 160.83
Total CHOL/HDL Ratio: 3
Triglycerides: 43 mg/dL (ref 0.0–149.0)
VLDL: 8.6 mg/dL (ref 0.0–40.0)

## 2022-01-11 LAB — HEMOGLOBIN A1C: Hgb A1c MFr Bld: 6.5 % (ref 4.6–6.5)

## 2022-01-11 LAB — TSH: TSH: 1.3 u[IU]/mL (ref 0.35–5.50)

## 2022-01-11 LAB — VITAMIN B12: Vitamin B-12: 196 pg/mL — ABNORMAL LOW (ref 211–911)

## 2022-01-11 LAB — VITAMIN D 25 HYDROXY (VIT D DEFICIENCY, FRACTURES): VITD: 24.42 ng/mL — ABNORMAL LOW (ref 30.00–100.00)

## 2022-01-11 NOTE — Progress Notes (Signed)
Established Patient Office Visit     CC/Reason for Visit: Annual preventive exam  HPI: Yvonne Marshall is a 66 y.o. female who is coming in today for the above mentioned reasons. Past Medical History is significant for: Hyperlipidemia not on medication, impaired glucose tolerance, GERD and vitamin D deficiency.  She is feeling well and has no acute concerns or complaints.  She is overdue for eye and dental exams.  She is overdue for bivalent COVID and flu vaccines.  She is overdue for mammogram and a Pap smear.   Past Medical/Surgical History: Past Medical History:  Diagnosis Date   Allergic rhinitis    Anemia, mild    Anxiety    Asthma, mild    Chest pain, atypical    Degenerative joint disease    GERD (gastroesophageal reflux disease)    Hemorrhoids    Hyperlipidemia    IBS (irritable bowel syndrome)     Past Surgical History:  Procedure Laterality Date   EXPLORATORY LAPAROTOMY     for adhesions   FINGER SURGERY     HYSTEROSCOPY  1998   ROTATOR CUFF REPAIR Left 1990    Social History:  reports that she has never smoked. She has never used smokeless tobacco. She reports current alcohol use of about 3.0 - 4.0 standard drinks of alcohol per week. She reports that she does not use drugs.  Allergies: No Known Allergies  Family History:  Family History  Problem Relation Age of Onset   Alcohol abuse Father    Cancer Other        sibling   Breast cancer Maternal Grandmother        60's   Heart failure Mother    Diabetes Mother    Colon cancer Neg Hx    Esophageal cancer Neg Hx    Rectal cancer Neg Hx    Stomach cancer Neg Hx      Current Outpatient Medications:    Ascorbic Acid (VITAMIN C) 1000 MG tablet, Take 1,000 mg by mouth daily. (Patient not taking: Reported on 01/11/2022), Disp: , Rfl:    atorvastatin (LIPITOR) 10 MG tablet, Take 1 tablet (10 mg total) by mouth daily. (Patient not taking: Reported on 01/11/2022), Disp: 90 tablet, Rfl: 1    Cholecalciferol (VITAMIN D) 2000 UNITS tablet, Take 1 tablet (2,000 Units total) by mouth daily. (Patient not taking: Reported on 01/11/2022), Disp: 90 tablet, Rfl: 1   ferrous sulfate 325 (65 FE) MG tablet, Take 325 mg by mouth daily with breakfast. (Patient not taking: Reported on 01/11/2022), Disp: , Rfl:    methocarbamol (ROBAXIN) 500 MG tablet, Take 1 tablet (500 mg total) by mouth every 8 (eight) hours as needed for muscle spasms. (Patient not taking: Reported on 01/11/2022), Disp: 20 tablet, Rfl: 0   ondansetron (ZOFRAN-ODT) 8 MG disintegrating tablet, , Disp: , Rfl:   Review of Systems:  Constitutional: Denies fever, chills, diaphoresis, appetite change and fatigue.  HEENT: Denies photophobia, eye pain, redness, hearing loss, ear pain, congestion, sore throat, rhinorrhea, sneezing, mouth sores, trouble swallowing, neck pain, neck stiffness and tinnitus.   Respiratory: Denies SOB, DOE, cough, chest tightness,  and wheezing.   Cardiovascular: Denies chest pain, palpitations and leg swelling.  Gastrointestinal: Denies nausea, vomiting, abdominal pain, diarrhea, constipation, blood in stool and abdominal distention.  Genitourinary: Denies dysuria, urgency, frequency, hematuria, flank pain and difficulty urinating.  Endocrine: Denies: hot or cold intolerance, sweats, changes in hair or nails, polyuria, polydipsia. Musculoskeletal: Denies myalgias, back  pain, joint swelling, arthralgias and gait problem.  Skin: Denies pallor, rash and wound.  Neurological: Denies dizziness, seizures, syncope, weakness, light-headedness, numbness and headaches.  Hematological: Denies adenopathy. Easy bruising, personal or family bleeding history  Psychiatric/Behavioral: Denies suicidal ideation, mood changes, confusion, nervousness, sleep disturbance and agitation    Physical Exam: Vitals:   01/11/22 1149  BP: 110/80  Pulse: 64  Temp: 97.7 F (36.5 C)  TempSrc: Oral  SpO2: 99%  Weight: 149 lb (67.6 kg)   Height: 5' 5.5" (1.664 m)    Body mass index is 24.42 kg/m.   Constitutional: NAD, calm, comfortable Eyes: PERRL, lids and conjunctivae normal ENMT: Mucous membranes are moist. Posterior pharynx clear of any exudate or lesions. Normal dentition. Tympanic membrane is pearly white, no erythema or bulging. Neck: normal, supple, no masses, no thyromegaly Respiratory: clear to auscultation bilaterally, no wheezing, no crackles. Normal respiratory effort. No accessory muscle use.  Cardiovascular: Regular rate and rhythm, no murmurs / rubs / gallops. No extremity edema. 2+ pedal pulses. No carotid bruits.  Abdomen: no tenderness, no masses palpated. No hepatosplenomegaly. Bowel sounds positive.  Musculoskeletal: no clubbing / cyanosis. No joint deformity upper and lower extremities. Good ROM, no contractures. Normal muscle tone.  Skin: no rashes, lesions, ulcers. No induration Neurologic: CN 2-12 grossly intact. Sensation intact, DTR normal. Strength 5/5 in all 4.  Psychiatric: Normal judgment and insight. Alert and oriented x 3. Normal mood.   [place] Visit from 01/11/2022 in [place] at [place] [facility]  PHQ-9 Total Score 9         Impression and Plan:  Encounter for preventive health examination  - Plan: CBC with Differential/Platelet, Comprehensive metabolic panel, TSH, Vitamin B12 -Recommend routine eye and dental care. -Immunizations: She will get flu and COVID vaccines at pharmacy -Healthy lifestyle discussed in detail. -Labs to be updated today. -Colon cancer screening: 11/2012, 10-year callback -Breast cancer screening: 11/2020, she will call to schedule -Cervical cancer screening: GYN referral placed, overdue -Lung cancer screening: Not applicable -Prostate cancer screening: Not applicable -DEXA: Not applicable  Screening for cervical cancer  - Plan: Ambulatory referral to Gynecology  Vision changes  - Plan: Ambulatory referral to  Ophthalmology  Vitamin D deficiency  - Plan: VITAMIN D 25 Hydroxy (Vit-D Deficiency, Fractures)  IGT (impaired glucose tolerance)  - Plan: Hemoglobin A1c  Hypercholesterolemia  - Plan: Lipid panel      Cora Brierley Isaac Bliss, MD [facility] at [facility]

## 2022-01-12 ENCOUNTER — Encounter: Payer: Self-pay | Admitting: Internal Medicine

## 2022-01-12 ENCOUNTER — Other Ambulatory Visit: Payer: Self-pay | Admitting: Internal Medicine

## 2022-01-12 DIAGNOSIS — E1169 Type 2 diabetes mellitus with other specified complication: Secondary | ICD-10-CM

## 2022-01-12 DIAGNOSIS — E538 Deficiency of other specified B group vitamins: Secondary | ICD-10-CM | POA: Insufficient documentation

## 2022-01-12 DIAGNOSIS — E559 Vitamin D deficiency, unspecified: Secondary | ICD-10-CM

## 2022-01-12 MED ORDER — VITAMIN D (ERGOCALCIFEROL) 1.25 MG (50000 UNIT) PO CAPS: 50000.0000 [IU] | ORAL_CAPSULE | ORAL | 0 refills | Status: DC

## 2022-01-12 MED ORDER — METFORMIN HCL 500 MG PO TABS: 500.0000 mg | ORAL_TABLET | Freq: Two times a day (BID) | ORAL | 1 refills | Status: DC

## 2022-01-12 MED ORDER — ATORVASTATIN CALCIUM 40 MG PO TABS: 40.0000 mg | ORAL_TABLET | Freq: Every day | ORAL | 1 refills | Status: DC

## 2022-01-13 ENCOUNTER — Telehealth: Payer: Self-pay | Admitting: Internal Medicine

## 2022-01-13 NOTE — Telephone Encounter (Signed)
Patient returned call regarding lab results. I let her know that [place] and Dr.Hernandez were out of the office today and would be back in Monday. Patient would like a callback regarding results.        Please advise

## 2022-01-14 ENCOUNTER — Ambulatory Visit
Admission: EM | Admit: 2022-01-14 | Discharge: 2022-01-14 | Disposition: A | Discharge status: Home / Self Care | Payer: 59 | Attending: Physician Assistant | Admitting: Physician Assistant

## 2022-01-14 DIAGNOSIS — J069 Acute upper respiratory infection, unspecified: Secondary | ICD-10-CM | POA: Insufficient documentation

## 2022-01-14 DIAGNOSIS — U071 COVID-19: Secondary | ICD-10-CM | POA: Insufficient documentation

## 2022-01-14 MED ORDER — PROMETHAZINE-DM 6.25-15 MG/5ML PO SYRP: 5.0000 mL | ORAL_SOLUTION | Freq: Four times a day (QID) | ORAL | 0 refills | Status: DC | PRN

## 2022-01-14 NOTE — Discharge Instructions (Signed)
Can take cough syrup as needed for cough Recommend take Flonase and Mucinex Can take Tylenol or Ibuprofen as needed for headache Drink plenty of fluids, rest Will call with COVID results.  If positive stay home for 5 days and then wear a mask around others for 5 days.

## 2022-01-14 NOTE — ED Triage Notes (Signed)
Pt presents with nasal congestion, sinus pressure, and cough X 2 days; pt states she has also had nosebleeds today.

## 2022-01-14 NOTE — ED Provider Notes (Addendum)
[facility]    CSN: [id number] Arrival date & time: 01/14/22  1529      History   Chief Complaint Chief Complaint  Patient presents with   URI   Epistaxis    HPI Yvonne Marshall is a 66 y.o. female.   Pt complains of nasal congestion, sinus pressure, and cough that started two days ago.  She reports seeing a little blood when blowing her nose this morning.  She denies fever, chills, n/v/d, body aches.  She has taken nothing for the sx.  She reports people at work have tested positive for [place]. She reports her cough kept her up at night last night.     Past Medical History:  Diagnosis Date   Allergic rhinitis    Anemia, mild    Anxiety    Asthma, mild    Chest pain, atypical    Degenerative joint disease    GERD (gastroesophageal reflux disease)    Hemorrhoids    Hyperlipidemia    IBS (irritable bowel syndrome)     Patient Active Problem List   Diagnosis Date Noted   Vitamin B12 deficiency 01/12/2022   Vitamin D deficiency 10/25/2018   DM (diabetes mellitus), type 2 ([place]) 10/25/2018   Joint pain 12/14/2017   Physical exam, annual 02/18/2014   Anemia, mild    ACUTE BRONCHITIS 07/20/2009   GERD 01/04/2008   Osteoarthritis 01/04/2008   CHEST PAIN, ATYPICAL 01/02/2008   Hyperlipidemia associated with type 2 diabetes mellitus ([place]) 06/21/2007   Anxiety state 06/21/2007   Allergic rhinitis 06/21/2007   IRRITABLE BOWEL SYNDROME 06/21/2007   HEMORRHOIDS, HX OF 06/21/2007    Past Surgical History:  Procedure Laterality Date   EXPLORATORY LAPAROTOMY     for adhesions   FINGER SURGERY     HYSTEROSCOPY  1998   ROTATOR CUFF REPAIR Left 1990    OB History   No obstetric history on file.      Home Medications    Prior to Admission medications   Medication Sig Start Date End Date Taking? Authorizing Provider  promethazine-dextromethorphan (PROMETHAZINE-DM) 6.25-15 MG/5ML syrup Take 5 mLs by mouth 4 (four) times daily as needed for cough.  01/14/22  Yes Ward, Lenise Arena, PA-C  Ascorbic Acid (VITAMIN C) 1000 MG tablet Take 1,000 mg by mouth daily. Patient not taking: Reported on 01/11/2022    [provider]  atorvastatin (LIPITOR) 40 MG tablet Take 1 tablet (40 mg total) by mouth daily. 01/12/22   Isaac Bliss, Rayford Halsted, MD  Cholecalciferol (VITAMIN D) 2000 UNITS tablet Take 1 tablet (2,000 Units total) by mouth daily. Patient not taking: Reported on 01/11/2022 04/01/14   Noralee Space, MD  ferrous sulfate 325 (65 FE) MG tablet Take 325 mg by mouth daily with breakfast. Patient not taking: Reported on 01/11/2022    [provider]  metFORMIN (GLUCOPHAGE) 500 MG tablet Take 1 tablet (500 mg total) by mouth 2 (two) times daily with a meal. 01/12/22   Isaac Bliss, Rayford Halsted, MD  methocarbamol (ROBAXIN) 500 MG tablet Take 1 tablet (500 mg total) by mouth every 8 (eight) hours as needed for muscle spasms. Patient not taking: Reported on 01/11/2022 04/21/19   Eulas Post, MD  ondansetron (ZOFRAN-ODT) 8 MG disintegrating tablet  02/12/19   [provider]  Vitamin D, Ergocalciferol, (DRISDOL) 1.25 MG (50000 UNIT) CAPS capsule Take 1 capsule (50,000 Units total) by mouth every 7 (seven) days for 12 doses. 01/12/22 03/31/22  Isaac Bliss, Rayford Halsted,  MD    Family History Family History  Problem Relation Age of Onset   Alcohol abuse Father    Cancer Other        sibling   Breast cancer Maternal Grandmother        26's   Heart failure Mother    Diabetes Mother    Colon cancer Neg Hx    Esophageal cancer Neg Hx    Rectal cancer Neg Hx    Stomach cancer Neg Hx     Social History Social History   Tobacco Use   Smoking status: Never   Smokeless tobacco: Never  Vaping Use   Vaping Use: Never used  Substance Use Topics   Alcohol use: Yes    Alcohol/week: 3.0 - 4.0 standard drinks of alcohol    Types: 3 - 4 Cans of beer per week   Drug use: No     Allergies   Patient has no known  allergies.   Review of Systems Review of Systems  Constitutional:  Negative for chills and fever.  HENT:  Positive for congestion and sinus pressure. Negative for ear pain and sore throat.   Eyes:  Negative for pain and visual disturbance.  Respiratory:  Positive for cough. Negative for shortness of breath.   Cardiovascular:  Negative for chest pain and palpitations.  Gastrointestinal:  Negative for abdominal pain and vomiting.  Genitourinary:  Negative for dysuria and hematuria.  Musculoskeletal:  Negative for arthralgias and back pain.  Skin:  Negative for color change and rash.  Neurological:  Negative for seizures and syncope.  All other systems reviewed and are negative.    Physical Exam Triage Vital Signs ED Triage Vitals  Enc Vitals Group     BP 01/14/22 1546 120/76     Pulse Rate 01/14/22 1546 76     Resp 01/14/22 1546 17     Temp 01/14/22 1546 98.1 F (36.7 C)     Temp Source 01/14/22 1546 Oral     SpO2 01/14/22 1546 98 %     Weight --      Height --      Head Circumference --      Peak Flow --      Pain Score 01/14/22 1547 6     Pain Loc --      Pain Edu? --      Excl. in [place]? --    No data found.  Updated Vital Signs BP 120/76 (BP Location: Left Arm)   Pulse 76   Temp 98.1 F (36.7 C) (Oral)   Resp 17   SpO2 98%   Visual Acuity Right Eye Distance:   Left Eye Distance:   Bilateral Distance:    Right Eye Near:   Left Eye Near:    Bilateral Near:     Physical Exam Vitals and nursing note reviewed.  Constitutional:      General: She is not in acute distress.    Appearance: She is well-developed.  HENT:     Head: Normocephalic and atraumatic.  Eyes:     Conjunctiva/sclera: Conjunctivae normal.  Cardiovascular:     Rate and Rhythm: Normal rate and regular rhythm.     Heart sounds: No murmur heard. Pulmonary:     Effort: Pulmonary effort is normal. No respiratory distress.     Breath sounds: Normal breath sounds.  Abdominal:     Palpations:  Abdomen is soft.     Tenderness: There is no abdominal tenderness.  Musculoskeletal:  General: No swelling.     Cervical back: Neck supple.  Skin:    General: Skin is warm and dry.     Capillary Refill: Capillary refill takes less than 2 seconds.  Neurological:     Mental Status: She is alert.  Psychiatric:        Mood and Affect: Mood normal.      UC Treatments / Results  Labs (all labs ordered are listed, but only abnormal results are displayed) Labs Reviewed - No data to display  EKG   Radiology No results found.  Procedures Procedures (including critical care time)  Medications Ordered in UC Medications - No data to display  Initial Impression / Assessment and Plan / UC Course  I have reviewed the triage vital signs and the nursing notes.  Pertinent labs & imaging results that were available during my care of the patient were reviewed by me and considered in my medical decision making (see chart for details).     URI, COVID test pending.  Pt overall well appearing, no acute distress, lungs clear.  Supportive care discussed.  Return precautions discussed.  Final Clinical Impressions(s) / UC Diagnoses   Final diagnoses:  Acute upper respiratory infection     Discharge Instructions      Can take cough syrup as needed for cough Recommend take Flonase and Mucinex Can take Tylenol or Ibuprofen as needed for headache Drink plenty of fluids, rest Will call with COVID results.  If positive stay home for 5 days and then wear a mask around others for 5 days.    ED Prescriptions     Medication Sig Dispense Auth. Provider   promethazine-dextromethorphan (PROMETHAZINE-DM) 6.25-15 MG/5ML syrup Take 5 mLs by mouth 4 (four) times daily as needed for cough. 118 mL Ward, Lenise Arena, PA-C      PDMP not reviewed this encounter.   Ward, Lenise Arena, PA-C 01/14/22 1620    Ward, Lenise Arena, PA-C 01/14/22 1620

## 2022-01-15 LAB — SARS CORONAVIRUS 2 BY RT PCR: SARS Coronavirus 2 by RT PCR: POSITIVE — AB

## 2022-01-16 NOTE — Telephone Encounter (Signed)
Left message on machine for patient to return our call.  See lab result note. 

## 2022-01-18 ENCOUNTER — Telehealth (INDEPENDENT_AMBULATORY_CARE_PROVIDER_SITE_OTHER): Payer: 59 | Admitting: Internal Medicine

## 2022-01-18 ENCOUNTER — Telehealth: Payer: Self-pay | Admitting: Internal Medicine

## 2022-01-18 ENCOUNTER — Other Ambulatory Visit: Payer: Self-pay | Admitting: Internal Medicine

## 2022-01-18 DIAGNOSIS — U071 COVID-19: Secondary | ICD-10-CM

## 2022-01-18 DIAGNOSIS — E1169 Type 2 diabetes mellitus with other specified complication: Secondary | ICD-10-CM

## 2022-01-18 DIAGNOSIS — E559 Vitamin D deficiency, unspecified: Secondary | ICD-10-CM

## 2022-01-18 MED ORDER — NIRMATRELVIR/RITONAVIR (PAXLOVID)TABLET: 3.0000 | ORAL_TABLET | Freq: Two times a day (BID) | ORAL | 0 refills | Status: AC

## 2022-01-18 NOTE — Telephone Encounter (Signed)
Pt states she tested positive for covid and would like to discuss treatment/medication options.

## 2022-01-18 NOTE — Progress Notes (Signed)
Virtual Visit via Video Note  I connected with Yvonne Marshall on 01/18/22 at  3:15 PM EDT by a video enabled telemedicine application and verified that I am speaking with the correct person using two identifiers.  Location patient: home Location provider: work office Persons participating in the virtual visit: patient, provider  I discussed the limitations of evaluation and management by telemedicine and the availability of in person appointments. The patient expressed understanding and agreed to proceed.   HPI: She called [place] to inform that she tested positive for COVID.  On Saturday, 5 days prior she started exhibiting URI symptoms such as postnasal drip, nasal congestion and sinus pressure.  She has not had fever, chills or shortness of breath.  She did have some work exposure with some colleagues that tested positive.  She did go to urgent care on Saturday and was notified on Monday that her test was positive.   ROS: Constitutional: Denies fever, chills, diaphoresis.  HEENT: Denies photophobia, eye pain, redness, mouth sores, trouble swallowing, neck pain, neck stiffness and tinnitus.   Respiratory: Denies SOB, DOE, cough, chest tightness,  and wheezing.   Cardiovascular: Denies chest pain, palpitations and leg swelling.  Gastrointestinal: Denies nausea, vomiting, abdominal pain, diarrhea, constipation, blood in stool and abdominal distention.  Genitourinary: Denies dysuria, urgency, frequency, hematuria, flank pain and difficulty urinating.  Endocrine: Denies: hot or cold intolerance, sweats, changes in hair or nails, polyuria, polydipsia. Musculoskeletal: Denies myalgias, back pain, joint swelling, arthralgias and gait problem.  Skin: Denies pallor, rash and wound.  Neurological: Denies dizziness, seizures, syncope, weakness, light-headedness, numbness and headaches.  Hematological: Denies adenopathy. Easy bruising, personal or family bleeding history  Psychiatric/Behavioral:  Denies suicidal ideation, mood changes, confusion, nervousness, sleep disturbance and agitation   Past Medical History:  Diagnosis Date   Allergic rhinitis    Anemia, mild    Anxiety    Asthma, mild    Chest pain, atypical    Degenerative joint disease    GERD (gastroesophageal reflux disease)    Hemorrhoids    Hyperlipidemia    IBS (irritable bowel syndrome)     Past Surgical History:  Procedure Laterality Date   EXPLORATORY LAPAROTOMY     for adhesions   FINGER SURGERY     HYSTEROSCOPY  1998   ROTATOR CUFF REPAIR Left 1990    Family History  Problem Relation Age of Onset   Alcohol abuse Father    Cancer Other        sibling   Breast cancer Maternal Grandmother        38's   Heart failure Mother    Diabetes Mother    Colon cancer Neg Hx    Esophageal cancer Neg Hx    Rectal cancer Neg Hx    Stomach cancer Neg Hx     SOCIAL HX:   reports that she has never smoked. She has never used smokeless tobacco. She reports current alcohol use of about 3.0 - 4.0 standard drinks of alcohol per week. She reports that she does not use drugs.   Current Outpatient Medications:    Ascorbic Acid (VITAMIN C) 1000 MG tablet, Take 1,000 mg by mouth daily., Disp: , Rfl:    atorvastatin (LIPITOR) 40 MG tablet, Take 1 tablet (40 mg total) by mouth daily., Disp: 90 tablet, Rfl: 1   Cholecalciferol (VITAMIN D) 2000 UNITS tablet, Take 1 tablet (2,000 Units total) by mouth daily., Disp: 90 tablet, Rfl: 1   dextromethorphan-guaiFENesin ([place] DM) 30-600  MG 12hr tablet, Take 1 tablet by mouth 2 (two) times daily., Disp: , Rfl:    ferrous sulfate 325 (65 FE) MG tablet, Take 325 mg by mouth daily with breakfast., Disp: , Rfl:    fluticasone (FLONASE) 50 MCG/ACT nasal spray, Place into both nostrils daily., Disp: , Rfl:    metFORMIN (GLUCOPHAGE) 500 MG tablet, Take 1 tablet (500 mg total) by mouth 2 (two) times daily with a meal., Disp: 180 tablet, Rfl: 1   methocarbamol (ROBAXIN) 500 MG  tablet, Take 1 tablet (500 mg total) by mouth every 8 (eight) hours as needed for muscle spasms., Disp: 20 tablet, Rfl: 0   nirmatrelvir/ritonavir EUA (PAXLOVID) 20 x 150 MG & 10 x '100MG'$  TABS, Take 3 tablets by mouth 2 (two) times daily for 5 days. (Take nirmatrelvir 150 mg two tablets twice daily for 5 days and ritonavir 100 mg one tablet twice daily for 5 days) Patient GFR is 91, Disp: 30 tablet, Rfl: 0   ondansetron (ZOFRAN-ODT) 8 MG disintegrating tablet, , Disp: , Rfl:    promethazine-dextromethorphan (PROMETHAZINE-DM) 6.25-15 MG/5ML syrup, Take 5 mLs by mouth 4 (four) times daily as needed for cough., Disp: 118 mL, Rfl: 0   Vitamin D, Ergocalciferol, (DRISDOL) 1.25 MG (50000 UNIT) CAPS capsule, Take 1 capsule (50,000 Units total) by mouth every 7 (seven) days for 12 doses., Disp: 12 capsule, Rfl: 0  EXAM:   VITALS per patient if applicable: None reported  GENERAL: alert, oriented, appears well and in no acute distress  HEENT: atraumatic, conjunttiva clear, no obvious abnormalities on inspection of external nose and ears  NECK: normal movements of the head and neck  LUNGS: on inspection no signs of respiratory distress, breathing rate appears normal, no obvious gross increased work of breathing, gasping or wheezing  CV: no obvious cyanosis  MS: moves all visible extremities without noticeable abnormality  PSYCH/NEURO: pleasant and cooperative, no obvious depression or anxiety, speech and thought processing grossly intact  ASSESSMENT AND PLAN:   COVID-19 - Plan: nirmatrelvir/ritonavir EUA (PAXLOVID) 20 x 150 MG & 10 x '100MG'$  TABS -She may also use OTC medications such as antihistamines, decongestants, pain relievers, guaifenesin. -We have reviewed quarantine period of 5 days. -We have discussed symptoms that would promote ED evaluation. -She knows to follow with [place] if symptoms fail to resolve.     I discussed the assessment and treatment plan with the patient. The patient was  provided an opportunity to ask questions and all were answered. The patient agreed with the plan and demonstrated an understanding of the instructions.   The patient was advised to call back or seek an in-person evaluation if the symptoms worsen or if the condition fails to improve as anticipated.    Lelon Frohlich, MD  [facility]

## 2022-01-18 NOTE — Telephone Encounter (Signed)
Pt calling wanting to discuss lab results. Requests call back

## 2022-01-18 NOTE — Telephone Encounter (Signed)
Patient will need a virtual visit

## 2022-01-18 NOTE — Telephone Encounter (Signed)
Pt called to ask if CMA could call her back regarding questions she had about the B12 shot. She stated she was asked to think about it and has decided to get them...   However:  Pt would like to know if it is alright to take the B12 shot and Vitamin D simultaneously?  She stated she read somewhere that it is not good to take them together.  Please advise. [phone]

## 2022-01-19 NOTE — Telephone Encounter (Signed)
Left message on machine returning patient's call 

## 2022-01-19 NOTE — Telephone Encounter (Signed)
Left message on machine for patient to return our call 

## 2022-01-23 ENCOUNTER — Ambulatory Visit (INDEPENDENT_AMBULATORY_CARE_PROVIDER_SITE_OTHER): Payer: 59

## 2022-01-23 DIAGNOSIS — E538 Deficiency of other specified B group vitamins: Secondary | ICD-10-CM | POA: Diagnosis not present

## 2022-01-23 MED ORDER — ATORVASTATIN CALCIUM 40 MG PO TABS: 40.0000 mg | ORAL_TABLET | Freq: Every day | ORAL | 1 refills | Status: DC

## 2022-01-23 MED ORDER — CYANOCOBALAMIN 1000 MCG/ML IJ SOLN
1000.0000 ug | Freq: Once | INTRAMUSCULAR | Status: AC
  Administered 2022-01-23: 1000 ug via INTRAMUSCULAR

## 2022-01-23 MED ORDER — METFORMIN HCL 500 MG PO TABS: 500.0000 mg | ORAL_TABLET | Freq: Two times a day (BID) | ORAL | 1 refills | Status: DC

## 2022-01-23 NOTE — Addendum Note (Signed)
Addended by: Westley Hummer B on: 01/23/2022 03:27 PM   Modules accepted: Orders

## 2022-01-23 NOTE — Progress Notes (Signed)
Pt here for monthly B12 injection per Dr. Jerilee Hoh.  B12 109mg given IM, and pt tolerated injection well.

## 2022-01-23 NOTE — Telephone Encounter (Signed)
Spoke with patient.

## 2022-01-31 ENCOUNTER — Ambulatory Visit (INDEPENDENT_AMBULATORY_CARE_PROVIDER_SITE_OTHER): Payer: 59

## 2022-01-31 DIAGNOSIS — E538 Deficiency of other specified B group vitamins: Secondary | ICD-10-CM | POA: Diagnosis not present

## 2022-01-31 MED ORDER — CYANOCOBALAMIN 1000 MCG/ML IJ SOLN
1000.0000 ug | Freq: Once | INTRAMUSCULAR | Status: AC
  Administered 2022-01-31: 1000 ug via INTRAMUSCULAR

## 2022-01-31 NOTE — Progress Notes (Signed)
Pt here for monthly B12 injection per Crista Elliot.  B12 1019mg given IM, and pt tolerated injection well.  Next B12 injection scheduled for next week.

## 2022-02-07 ENCOUNTER — Ambulatory Visit (INDEPENDENT_AMBULATORY_CARE_PROVIDER_SITE_OTHER): Payer: 59 | Admitting: *Deleted

## 2022-02-07 DIAGNOSIS — E538 Deficiency of other specified B group vitamins: Secondary | ICD-10-CM

## 2022-02-07 MED ORDER — CYANOCOBALAMIN 1000 MCG/ML IJ SOLN
1000.0000 ug | INTRAMUSCULAR | Status: AC
  Administered 2022-02-07 – 2022-02-14 (×2): 1000 ug via INTRAMUSCULAR

## 2022-02-07 NOTE — Progress Notes (Signed)
Per orders of Dr. Hernandez, injection of B12 given by Whitten Andreoni. Patient tolerated injection well.  

## 2022-02-14 ENCOUNTER — Ambulatory Visit (INDEPENDENT_AMBULATORY_CARE_PROVIDER_SITE_OTHER): Payer: 59

## 2022-02-14 DIAGNOSIS — E538 Deficiency of other specified B group vitamins: Secondary | ICD-10-CM | POA: Diagnosis not present

## 2022-02-14 NOTE — Progress Notes (Signed)
Pt here for weekly B12 injection #4 of 4 per Dr Jerilee Hoh  B12 1039mg given IM right deltoid and pt tolerated injection well.  Next B12 injection scheduled for 03/16/22.

## 2022-03-06 ENCOUNTER — Other Ambulatory Visit: Payer: Self-pay | Admitting: Internal Medicine

## 2022-03-06 DIAGNOSIS — Z1231 Encounter for screening mammogram for malignant neoplasm of breast: Secondary | ICD-10-CM

## 2022-03-11 ENCOUNTER — Ambulatory Visit (INDEPENDENT_AMBULATORY_CARE_PROVIDER_SITE_OTHER): Payer: 59

## 2022-03-11 ENCOUNTER — Encounter: Payer: Self-pay | Admitting: Emergency Medicine

## 2022-03-11 ENCOUNTER — Other Ambulatory Visit: Payer: Self-pay

## 2022-03-11 ENCOUNTER — Ambulatory Visit
Admission: EM | Admit: 2022-03-11 | Discharge: 2022-03-11 | Disposition: A | Discharge status: Home / Self Care | Payer: 59 | Attending: Family Medicine | Admitting: Family Medicine

## 2022-03-11 DIAGNOSIS — J019 Acute sinusitis, unspecified: Secondary | ICD-10-CM | POA: Diagnosis not present

## 2022-03-11 DIAGNOSIS — R079 Chest pain, unspecified: Secondary | ICD-10-CM | POA: Diagnosis not present

## 2022-03-11 DIAGNOSIS — J4521 Mild intermittent asthma with (acute) exacerbation: Secondary | ICD-10-CM

## 2022-03-11 HISTORY — DX: Type 2 diabetes mellitus without complications: E11.9

## 2022-03-11 MED ORDER — PREDNISONE 20 MG PO TABS: 40.0000 mg | ORAL_TABLET | Freq: Every day | ORAL | 0 refills | Status: AC

## 2022-03-11 MED ORDER — ALBUTEROL SULFATE HFA 108 (90 BASE) MCG/ACT IN AERS: 2.0000 | INHALATION_SPRAY | RESPIRATORY_TRACT | 0 refills | Status: DC | PRN

## 2022-03-11 MED ORDER — CEFDINIR 300 MG PO CAPS: 600.0000 mg | ORAL_CAPSULE | Freq: Every day | ORAL | 0 refills | Status: AC

## 2022-03-11 NOTE — ED Triage Notes (Signed)
Pt said x 1 week has had cough, nasal drainage, and headache. Pt said when you touch her chest she can feel the pressure and pressure and pain in her nasal area. No fevers, no chills

## 2022-03-11 NOTE — Discharge Instructions (Addendum)
Your chest x-ray did not show any pneumonia or fluid  Your EKG looked good.  Albuterol inhaler--do 2 puffs every 4 hours as needed for shortness of breath or wheezing  Take prednisone 20 mg--2 daily for 5 days  Take cefdinir 300 mg--2 capsules together daily for 7 days  Please follow-up with your primary care about these issues   If you are feeling worse with more shortness of breath or more pain or more weakness, then please proceed to the emergency room for further evaluation

## 2022-03-11 NOTE — ED Provider Notes (Signed)
[facility]    CSN: [id number] Arrival date & time: 03/11/22  1151      History   Chief Complaint Chief Complaint  Patient presents with   Cough   Nasal Congestion    HPI Yvonne Marshall is a 66 y.o. female.    Cough  Here with pressure in her chest like someone is sitting on her since 6 hours ago.  She does also have a little bit of feeling short of breath and may be feeling like she is wheezing.  This pressure and pain does not radiate.  It is hard for her to tell if it gets worse when she is doing much of anything, but she has not felt like doing much today when she was in her house.  Palpitations no dizziness.  She did have some sweating when she got to her car to drive here this afternoon, but she states she sweats a lot anyway  On August 19 she tested positive for COVID.  She did take an antiviral then.  She states that she has had postnasal drainage and cough ever since then.  She also has some pressure on her left cheek and side of her head.  Past Medical History:  Diagnosis Date   Allergic rhinitis    Anemia, mild    Anxiety    Asthma, mild    Chest pain, atypical    Degenerative joint disease    Diabetes mellitus without complication (HCC)    GERD (gastroesophageal reflux disease)    Hemorrhoids    Hyperlipidemia    IBS (irritable bowel syndrome)     Patient Active Problem List   Diagnosis Date Noted   Vitamin B12 deficiency 01/12/2022   Vitamin D deficiency 10/25/2018   DM (diabetes mellitus), type 2 ([place]) 10/25/2018   Joint pain 12/14/2017   Physical exam, annual 02/18/2014   Anemia, mild    ACUTE BRONCHITIS 07/20/2009   GERD 01/04/2008   Osteoarthritis 01/04/2008   CHEST PAIN, ATYPICAL 01/02/2008   Hyperlipidemia associated with type 2 diabetes mellitus ([place]) 06/21/2007   Anxiety state 06/21/2007   Allergic rhinitis 06/21/2007   IRRITABLE BOWEL SYNDROME 06/21/2007   HEMORRHOIDS, HX OF 06/21/2007    Past Surgical History:   Procedure Laterality Date   EXPLORATORY LAPAROTOMY     for adhesions   FINGER SURGERY     HYSTEROSCOPY  1998   ROTATOR CUFF REPAIR Left 1990    OB History   No obstetric history on file.      Home Medications    Prior to Admission medications   Medication Sig Start Date End Date Taking? Authorizing Provider  albuterol (VENTOLIN HFA) 108 (90 Base) MCG/ACT inhaler Inhale 2 puffs into the lungs every 4 (four) hours as needed for wheezing or shortness of breath. 03/11/22  Yes Barrett Henle, MD  cefdinir (OMNICEF) 300 MG capsule Take 2 capsules (600 mg total) by mouth daily for 7 days. 03/11/22 03/18/22 Yes Lakin Romer, Gwenlyn Perking, MD  predniSONE (DELTASONE) 20 MG tablet Take 2 tablets (40 mg total) by mouth daily with breakfast for 5 days. 03/11/22 03/16/22 Yes BanisterGwenlyn Perking, MD  Ascorbic Acid (VITAMIN C) 1000 MG tablet Take 1,000 mg by mouth daily.    [provider]  atorvastatin (LIPITOR) 40 MG tablet Take 1 tablet (40 mg total) by mouth daily. 01/23/22   Isaac Bliss, Rayford Halsted, MD  Cholecalciferol (VITAMIN D) 2000 UNITS tablet Take 1 tablet (2,000 Units total) by mouth daily. 04/01/14  Noralee Space, MD  ferrous sulfate 325 (65 FE) MG tablet Take 325 mg by mouth daily with breakfast.    [provider]  fluticasone (FLONASE) 50 MCG/ACT nasal spray Place into both nostrils daily.    [provider]  metFORMIN (GLUCOPHAGE) 500 MG tablet Take 1 tablet (500 mg total) by mouth 2 (two) times daily with a meal. 01/23/22   Isaac Bliss, Rayford Halsted, MD  methocarbamol (ROBAXIN) 500 MG tablet Take 1 tablet (500 mg total) by mouth every 8 (eight) hours as needed for muscle spasms. 04/21/19   Burchette, Alinda Sierras, MD  Vitamin D, Ergocalciferol, (DRISDOL) 1.25 MG (50000 UNIT) CAPS capsule TAKE 1 CAPSULE BY MOUTH EVERY 7 DAYS FOR 12 DOSES 01/19/22   Isaac Bliss, Rayford Halsted, MD    Family History Family History  Problem Relation Age of Onset   Alcohol abuse  Father    Cancer Other        sibling   Breast cancer Maternal Grandmother        39's   Heart failure Mother    Diabetes Mother    Colon cancer Neg Hx    Esophageal cancer Neg Hx    Rectal cancer Neg Hx    Stomach cancer Neg Hx     Social History Social History   Tobacco Use   Smoking status: Never   Smokeless tobacco: Never  Vaping Use   Vaping Use: Never used  Substance Use Topics   Alcohol use: Yes    Alcohol/week: 3.0 - 4.0 standard drinks of alcohol    Types: 3 - 4 Cans of beer per week   Drug use: No     Allergies   Patient has no known allergies.   Review of Systems Review of Systems  Respiratory:  Positive for cough.      Physical Exam Triage Vital Signs ED Triage Vitals  Enc Vitals Group     BP 03/11/22 1328 134/79     Pulse Rate 03/11/22 1328 60     Resp 03/11/22 1328 16     Temp 03/11/22 1328 98.1 F (36.7 C)     Temp src --      SpO2 03/11/22 1328 98 %     Weight --      Height --      Head Circumference --      Peak Flow --      Pain Score 03/11/22 1329 4     Pain Loc --      Pain Edu? --      Excl. in [place]? --    No data found.  Updated Vital Signs BP 134/79 (BP Location: Right Arm)   Pulse 60   Temp 98.1 F (36.7 C)   Resp 16   SpO2 98%   Visual Acuity Right Eye Distance:   Left Eye Distance:   Bilateral Distance:    Right Eye Near:   Left Eye Near:    Bilateral Near:     Physical Exam Vitals reviewed.  Constitutional:      General: She is not in acute distress.    Appearance: She is not toxic-appearing.  HENT:     Right Ear: Tympanic membrane and ear canal normal.     Left Ear: Tympanic membrane and ear canal normal.     Nose: Nose normal.     Mouth/Throat:     Mouth: Mucous membranes are moist.     Pharynx: No oropharyngeal exudate or posterior oropharyngeal erythema.  Eyes:     Extraocular Movements: Extraocular movements intact.     Conjunctiva/sclera: Conjunctivae normal.     Pupils: Pupils are equal,  round, and reactive to light.  Cardiovascular:     Rate and Rhythm: Normal rate and regular rhythm.     Heart sounds: No murmur heard. Pulmonary:     Effort: Pulmonary effort is normal. No respiratory distress.     Breath sounds: No stridor. No wheezing, rhonchi or rales.  Musculoskeletal:     Cervical back: Neck supple.  Lymphadenopathy:     Cervical: No cervical adenopathy.  Skin:    Capillary Refill: Capillary refill takes less than 2 seconds.     Coloration: Skin is not jaundiced or pale.  Neurological:     General: No focal deficit present.     Mental Status: She is alert and oriented to person, place, and time.  Psychiatric:        Behavior: Behavior normal.      UC Treatments / Results  Labs (all labs ordered are listed, but only abnormal results are displayed) Labs Reviewed - No data to display  EKG   Radiology DG Chest 2 View  Result Date: 03/11/2022 CLINICAL DATA:  Chest pressure since this morning. EXAM: CHEST - 2 VIEW COMPARISON:  05/05/2019. FINDINGS: Normal heart, mediastinum and hila. Clear lungs.  No pleural effusion or pneumothorax. Skeletal structures are intact. IMPRESSION: No active cardiopulmonary disease. Electronically Signed   By: Lajean Manes M.D.   On: 03/11/2022 14:35    Procedures Procedures (including critical care time)  Medications Ordered in UC Medications - No data to display  Initial Impression / Assessment and Plan / UC Course  I have reviewed the triage vital signs and the nursing notes.  Pertinent labs & imaging results that were available during my care of the patient were reviewed by me and considered in my medical decision making (see chart for details).        EKG is benign with sinus bradycardia, and no ST segment changes.  Chest x-ray does not show any infiltrate or fluid.  Vital signs are reassuring and I do think most of her chest symptoms are related to respiratory problems, with her having the little bit of  wheezing.  I want to treat her for possible asthma exacerbation and acute sinusitis.  Of asked her to follow-up with her primary care Also if she is worsening or not improving quickly then she needs to proceed to the emergency room  Final Clinical Impressions(s) / UC Diagnoses   Final diagnoses:  Acute sinusitis, recurrence not specified, unspecified location  Mild intermittent asthma with (acute) exacerbation     Discharge Instructions      Your chest x-ray did not show any pneumonia or fluid  Your EKG looked good.  Albuterol inhaler--do 2 puffs every 4 hours as needed for shortness of breath or wheezing  Take prednisone 20 mg--2 daily for 5 days  Take cefdinir 300 mg--2 capsules together daily for 7 days  Please follow-up with your primary care about these issues   If you are feeling worse with more shortness of breath or more pain or more weakness, then please proceed to the emergency room for further evaluation     ED Prescriptions     Medication Sig Dispense Auth. Provider   albuterol (VENTOLIN HFA) 108 (90 Base) MCG/ACT inhaler Inhale 2 puffs into the lungs every 4 (four) hours as needed for wheezing or shortness of breath. 1 each Ahmeek,  Gwenlyn Perking, MD   cefdinir (OMNICEF) 300 MG capsule Take 2 capsules (600 mg total) by mouth daily for 7 days. 14 capsule Barrett Henle, MD   predniSONE (DELTASONE) 20 MG tablet Take 2 tablets (40 mg total) by mouth daily with breakfast for 5 days. 10 tablet Windy Carina Gwenlyn Perking, MD      PDMP not reviewed this encounter.   Barrett Henle, MD 03/11/22 [phone]

## 2022-03-16 ENCOUNTER — Ambulatory Visit (INDEPENDENT_AMBULATORY_CARE_PROVIDER_SITE_OTHER): Payer: 59

## 2022-03-16 DIAGNOSIS — Z23 Encounter for immunization: Secondary | ICD-10-CM

## 2022-03-16 DIAGNOSIS — E538 Deficiency of other specified B group vitamins: Secondary | ICD-10-CM

## 2022-03-16 MED ORDER — CYANOCOBALAMIN 1000 MCG/ML IJ SOLN
1000.0000 ug | Freq: Once | INTRAMUSCULAR | Status: AC
  Administered 2022-03-16: 1000 ug via INTRAMUSCULAR

## 2022-03-16 NOTE — Progress Notes (Signed)
Per orders of Dr. Jerilee Hoh, injection of Cyanocobalamin 1014mg  given by KEncarnacion Slateson Right Deltoid.  Patient tolerated injection well.

## 2022-03-27 ENCOUNTER — Telehealth: Payer: Self-pay | Admitting: Internal Medicine

## 2022-03-27 DIAGNOSIS — E119 Type 2 diabetes mellitus without complications: Secondary | ICD-10-CM

## 2022-03-27 NOTE — Telephone Encounter (Signed)
Left message on machine for patient to return our call 

## 2022-03-27 NOTE — Telephone Encounter (Signed)
Spoke with patient.  Appointment scheduled. Referral placed per Dr Jerilee Hoh.

## 2022-03-27 NOTE — Telephone Encounter (Signed)
Pt call and stated she is having dizziness when she take metFormin and want a call back and you can leave a message if she don't answer and she will call you back

## 2022-03-31 ENCOUNTER — Ambulatory Visit: Payer: 59

## 2022-04-03 ENCOUNTER — Ambulatory Visit
Admission: RE | Admit: 2022-04-03 | Discharge: 2022-04-03 | Disposition: A | Discharge status: Home / Self Care | Payer: 59 | Source: Ambulatory Visit | Attending: Internal Medicine | Admitting: Internal Medicine

## 2022-04-03 ENCOUNTER — Ambulatory Visit: Payer: 59

## 2022-04-03 DIAGNOSIS — Z1231 Encounter for screening mammogram for malignant neoplasm of breast: Secondary | ICD-10-CM

## 2022-04-04 ENCOUNTER — Encounter: Payer: Self-pay | Admitting: Internal Medicine

## 2022-04-04 ENCOUNTER — Ambulatory Visit (INDEPENDENT_AMBULATORY_CARE_PROVIDER_SITE_OTHER): Payer: 59 | Admitting: Internal Medicine

## 2022-04-04 VITALS — BP 120/78 | HR 62 | Temp 98.2°F | Wt 152.8 lb

## 2022-04-04 DIAGNOSIS — E538 Deficiency of other specified B group vitamins: Secondary | ICD-10-CM

## 2022-04-04 DIAGNOSIS — E559 Vitamin D deficiency, unspecified: Secondary | ICD-10-CM | POA: Diagnosis not present

## 2022-04-04 DIAGNOSIS — E119 Type 2 diabetes mellitus without complications: Secondary | ICD-10-CM | POA: Diagnosis not present

## 2022-04-04 DIAGNOSIS — E1169 Type 2 diabetes mellitus with other specified complication: Secondary | ICD-10-CM

## 2022-04-04 DIAGNOSIS — E785 Hyperlipidemia, unspecified: Secondary | ICD-10-CM

## 2022-04-04 LAB — POCT GLYCOSYLATED HEMOGLOBIN (HGB A1C): Hemoglobin A1C: 6.4 % — AB (ref 4.0–5.6)

## 2022-04-04 NOTE — Progress Notes (Signed)
Established Patient Office Visit     CC/Reason for Visit: 43-monthfollow-up chronic medical conditions  HPI: Yvonne Gilbertiis a 66y.o. female who is coming in today for the above mentioned reasons. Past Medical History is significant for: Hyperlipidemia, newly diagnosed type 2 diabetes and vitamin D and B12 deficiencies.  These were all diagnosed during her annual preventive exam over the summer.  At time of diagnosis her A1c of 6.5, she was placed on metformin.  She has discontinued this as she believe it was causing a headache.  She was recently treated at urgent care for acute sinusitis.   Past Medical/Surgical History: Past Medical History:  Diagnosis Date   Allergic rhinitis    Anemia, mild    Anxiety    Asthma, mild    Chest pain, atypical    Degenerative joint disease    Diabetes mellitus without complication (HCC)    GERD (gastroesophageal reflux disease)    Hemorrhoids    Hyperlipidemia    IBS (irritable bowel syndrome)     Past Surgical History:  Procedure Laterality Date   EXPLORATORY LAPAROTOMY     for adhesions   FINGER SURGERY     HYSTEROSCOPY  1998   ROTATOR CUFF REPAIR Left 1990    Social History:  reports that she has never smoked. She has never used smokeless tobacco. She reports current alcohol use of about 3.0 - 4.0 standard drinks of alcohol per week. She reports that she does not use drugs.  Allergies: No Known Allergies  Family History:  Family History  Problem Relation Age of Onset   Heart failure Mother    Diabetes Mother    Alcohol abuse Father    Breast cancer Maternal Grandmother [phone]-- [phone]  Cancer Other        sibling   Colon cancer Neg Hx    Esophageal cancer Neg Hx    Rectal cancer Neg Hx    Stomach cancer Neg Hx      Current Outpatient Medications:    albuterol (VENTOLIN HFA) 108 (90 Base) MCG/ACT inhaler, Inhale 2 puffs into the lungs every 4 (four) hours as needed for wheezing or shortness of breath., Disp: 1 each,  Rfl: 0   Ascorbic Acid (VITAMIN C) 1000 MG tablet, Take 1,000 mg by mouth daily., Disp: , Rfl:    atorvastatin (LIPITOR) 40 MG tablet, Take 1 tablet (40 mg total) by mouth daily., Disp: 90 tablet, Rfl: 1   Cholecalciferol (VITAMIN D) 2000 UNITS tablet, Take 1 tablet (2,000 Units total) by mouth daily., Disp: 90 tablet, Rfl: 1   ferrous sulfate 325 (65 FE) MG tablet, Take 325 mg by mouth daily with breakfast., Disp: , Rfl:    fluticasone (FLONASE) 50 MCG/ACT nasal spray, Place into both nostrils daily., Disp: , Rfl:    methocarbamol (ROBAXIN) 500 MG tablet, Take 1 tablet (500 mg total) by mouth every 8 (eight) hours as needed for muscle spasms., Disp: 20 tablet, Rfl: 0   Vitamin D, Ergocalciferol, (DRISDOL) 1.25 MG (50000 UNIT) CAPS capsule, TAKE 1 CAPSULE BY MOUTH EVERY 7 DAYS FOR 12 DOSES, Disp: 12 capsule, Rfl: 0   metFORMIN (GLUCOPHAGE) 500 MG tablet, Take 1 tablet (500 mg total) by mouth 2 (two) times daily with a meal. (Patient not taking: Reported on 04/04/2022), Disp: 180 tablet, Rfl: 1  Review of Systems:  Constitutional: Denies fever, chills, diaphoresis, appetite change and fatigue.  HEENT: Denies photophobia, eye pain, redness, mouth sores, trouble swallowing,  neck pain, neck stiffness and tinnitus.   Respiratory: Denies SOB, DOE, cough, chest tightness,  and wheezing.   Cardiovascular: Denies chest pain, palpitations and leg swelling.  Gastrointestinal: Denies nausea, vomiting, abdominal pain, diarrhea, constipation, blood in stool and abdominal distention.  Genitourinary: Denies dysuria, urgency, frequency, hematuria, flank pain and difficulty urinating.  Endocrine: Denies: hot or cold intolerance, sweats, changes in hair or nails, polyuria, polydipsia. Musculoskeletal: Denies myalgias, back pain, joint swelling, arthralgias and gait problem.  Skin: Denies pallor, rash and wound.  Neurological: Denies dizziness, seizures, syncope, weakness, light-headedness, numbness. Hematological:  Denies adenopathy. Easy bruising, personal or family bleeding history  Psychiatric/Behavioral: Denies suicidal ideation, mood changes, confusion, nervousness, sleep disturbance and agitation    Physical Exam: Vitals:   04/04/22 1143  BP: 120/78  Pulse: 62  Temp: 98.2 F (36.8 C)  TempSrc: Oral  SpO2: 100%  Weight: 152 lb 12.8 oz (69.3 kg)    Body mass index is 25.04 kg/m.   Constitutional: NAD, calm, comfortable Eyes: PERRL, lids and conjunctivae normal ENMT: Mucous membranes are moist.  Respiratory: clear to auscultation bilaterally, no wheezing, no crackles. Normal respiratory effort. No accessory muscle use.  Cardiovascular: Regular rate and rhythm, no murmurs / rubs / gallops. No extremity edema.  Psychiatric: Normal judgment and insight. Alert and oriented x 3. Normal mood.    Impression and Plan:  Type 2 diabetes mellitus without complication, without long-term current use of insulin ([place]) - Plan: POCT glycosylated hemoglobin (Hb A1C)  Hyperlipidemia associated with type 2 diabetes mellitus (HCC)  Vitamin B12 deficiency  Vitamin D deficiency  -A1c demonstrates excellent glycemic control.  Okay to monitor without metformin for now. -She is on atorvastatin 40 mg, recheck lipids and liver function test when she returns in 3 months. -She will continue vitamin D and B12 repletion.   Time spent:33 minutes reviewing chart, interviewing and examining patient and formulating plan of care.       Lelon Frohlich, MD [facility] at [facility]

## 2022-04-18 ENCOUNTER — Ambulatory Visit (INDEPENDENT_AMBULATORY_CARE_PROVIDER_SITE_OTHER): Payer: 59

## 2022-04-18 DIAGNOSIS — E538 Deficiency of other specified B group vitamins: Secondary | ICD-10-CM

## 2022-04-18 MED ORDER — CYANOCOBALAMIN 1000 MCG/ML IJ SOLN
1000.0000 ug | Freq: Once | INTRAMUSCULAR | Status: AC
  Administered 2022-04-18: 1000 ug via INTRAMUSCULAR

## 2022-04-18 NOTE — Progress Notes (Signed)
Pt here for monthly B12 injection per Dr Jerilee Hoh.  B12 1017mg given IM, right deltoid and pt tolerated injection well.

## 2022-04-27 ENCOUNTER — Ambulatory Visit: Payer: 59 | Admitting: Internal Medicine

## 2022-05-02 ENCOUNTER — Ambulatory Visit: Payer: 59 | Admitting: Internal Medicine

## 2022-05-19 ENCOUNTER — Ambulatory Visit (INDEPENDENT_AMBULATORY_CARE_PROVIDER_SITE_OTHER): Payer: 59

## 2022-05-19 ENCOUNTER — Telehealth: Payer: Self-pay | Admitting: Internal Medicine

## 2022-05-19 DIAGNOSIS — E1169 Type 2 diabetes mellitus with other specified complication: Secondary | ICD-10-CM

## 2022-05-19 DIAGNOSIS — E538 Deficiency of other specified B group vitamins: Secondary | ICD-10-CM

## 2022-05-19 MED ORDER — CYANOCOBALAMIN 1000 MCG/ML IJ SOLN
1000.0000 ug | Freq: Once | INTRAMUSCULAR | Status: AC
  Administered 2022-05-19: 1000 ug via INTRAMUSCULAR

## 2022-05-19 NOTE — Progress Notes (Addendum)
Per orders of Isaac Bliss, Rayford Halsted, MD, injection of B12 given in Left deltoid by Franco Collet. Patient tolerated injection mildly.   Lab Results  Component Value Date   VITAMINB12 196 (L) 01/11/2022

## 2022-05-19 NOTE — Patient Instructions (Signed)
Health Maintenance Due  Topic Date Due   FOOT EXAM  Never done   OPHTHALMOLOGY EXAM  Never done   HIV Screening  Never done   Diabetic kidney evaluation - Urine ACR  Never done   COVID-19 Vaccine (4 - 2023-24 season) 01/27/2022      Row Labels 01/11/2022   11:57 AM 09/17/2020    2:05 PM 10/25/2018    8:13 AM  Depression screen PHQ 2/9   Section Header. No data exists in this row.     Decreased Interest   1 1 0  Down, Depressed, Hopeless   1 1 0  PHQ - 2 Score   2 2 0  Altered sleeping   '3 3 1  '$ Tired, decreased energy   2 3 0  Change in appetite   0 3 0  Feeling bad or failure about yourself    1 1 0  Trouble concentrating   0 0 0  Moving slowly or fidgety/restless   1 1 0  Suicidal thoughts   0 0 0  PHQ-9 Score   '9 13 1  '$ Difficult doing work/chores   Somewhat difficult  Not difficult at all

## 2022-05-19 NOTE — Telephone Encounter (Signed)
Patient needs a refill on Lipitor and Metformin.  Pharmacy- [facility] on [address]

## 2022-05-23 MED ORDER — METFORMIN HCL 500 MG PO TABS: 500.0000 mg | ORAL_TABLET | Freq: Two times a day (BID) | ORAL | 1 refills | Status: DC

## 2022-05-23 MED ORDER — ATORVASTATIN CALCIUM 40 MG PO TABS: 40.0000 mg | ORAL_TABLET | Freq: Every day | ORAL | 1 refills | Status: DC

## 2022-05-23 NOTE — Telephone Encounter (Signed)
Refill sent.

## 2022-06-06 NOTE — Telephone Encounter (Signed)
error 

## 2022-06-19 ENCOUNTER — Ambulatory Visit: Payer: 59

## 2022-06-20 ENCOUNTER — Ambulatory Visit (INDEPENDENT_AMBULATORY_CARE_PROVIDER_SITE_OTHER): Payer: 59 | Admitting: *Deleted

## 2022-06-20 DIAGNOSIS — E538 Deficiency of other specified B group vitamins: Secondary | ICD-10-CM

## 2022-06-20 MED ORDER — CYANOCOBALAMIN 1000 MCG/ML IJ SOLN
1000.0000 ug | Freq: Once | INTRAMUSCULAR | Status: AC
  Administered 2022-06-20: 1000 ug via INTRAMUSCULAR

## 2022-06-20 NOTE — Progress Notes (Signed)
Per orders of Dr. Hernandez, injection of Cyanocobalamin 1000mcg given by Zaiah Credeur A. Patient tolerated injection well. 

## 2022-06-26 ENCOUNTER — Encounter: Payer: 59 | Admitting: Obstetrics and Gynecology

## 2022-07-05 LAB — HM DIABETES EYE EXAM

## 2022-07-20 ENCOUNTER — Encounter: Payer: 59 | Admitting: Obstetrics and Gynecology

## 2022-07-21 ENCOUNTER — Ambulatory Visit: Payer: 59

## 2022-07-27 ENCOUNTER — Ambulatory Visit (INDEPENDENT_AMBULATORY_CARE_PROVIDER_SITE_OTHER): Payer: 59 | Admitting: Internal Medicine

## 2022-07-27 ENCOUNTER — Encounter: Payer: Self-pay | Admitting: Internal Medicine

## 2022-07-27 VITALS — BP 136/68 | HR 70 | Temp 97.8°F | Ht 65.5 in | Wt 151.4 lb

## 2022-07-27 DIAGNOSIS — E559 Vitamin D deficiency, unspecified: Secondary | ICD-10-CM | POA: Diagnosis not present

## 2022-07-27 DIAGNOSIS — E538 Deficiency of other specified B group vitamins: Secondary | ICD-10-CM

## 2022-07-27 DIAGNOSIS — E785 Hyperlipidemia, unspecified: Secondary | ICD-10-CM

## 2022-07-27 DIAGNOSIS — E119 Type 2 diabetes mellitus without complications: Secondary | ICD-10-CM

## 2022-07-27 DIAGNOSIS — E1169 Type 2 diabetes mellitus with other specified complication: Secondary | ICD-10-CM

## 2022-07-27 LAB — POCT GLYCOSYLATED HEMOGLOBIN (HGB A1C): Hemoglobin A1C: 6.2 % — AB (ref 4.0–5.6)

## 2022-07-27 MED ORDER — CYANOCOBALAMIN 1000 MCG/ML IJ SOLN
1000.0000 ug | Freq: Once | INTRAMUSCULAR | Status: AC
  Administered 2022-07-27: 1000 ug via INTRAMUSCULAR

## 2022-07-27 NOTE — Progress Notes (Signed)
Established Patient Office Visit     CC/Reason for Visit: Follow-up chronic conditions  HPI: Yvonne Marshall is a 67 y.o. female who is coming in today for the above mentioned reasons. Past Medical History is significant for: Hyperlipidemia, type 2 diabetes, vitamin D and B12 deficiencies.  She is feeling well and has no acute concerns or complaints.  She has started seeing endocrinology, Dr. Chalmers Marshall.   Past Medical/Surgical History: Past Medical History:  Diagnosis Date   Allergic rhinitis    Anemia, mild    Anxiety    Asthma, mild    Chest pain, atypical    Degenerative joint disease    Diabetes mellitus without complication (HCC)    GERD (gastroesophageal reflux disease)    Hemorrhoids    Hyperlipidemia    IBS (irritable bowel syndrome)     Past Surgical History:  Procedure Laterality Date   EXPLORATORY LAPAROTOMY     for adhesions   FINGER SURGERY     HYSTEROSCOPY  1998   ROTATOR CUFF REPAIR Left 1990    Social History:  reports that she has never smoked. She has never used smokeless tobacco. She reports current alcohol use of about 3.0 - 4.0 standard drinks of alcohol per week. She reports that she does not use drugs.  Allergies: No Known Allergies  Family History:  Family History  Problem Relation Age of Onset   Heart failure Mother    Diabetes Mother    Alcohol abuse Father    Breast cancer Maternal Grandmother 80 - 28   Cancer Other        sibling   Colon cancer Neg Hx    Esophageal cancer Neg Hx    Rectal cancer Neg Hx    Stomach cancer Neg Hx      Current Outpatient Medications:    albuterol (VENTOLIN HFA) 108 (90 Base) MCG/ACT inhaler, Inhale 2 puffs into the lungs every 4 (four) hours as needed for wheezing or shortness of breath., Disp: 1 each, Rfl: 0   Ascorbic Acid (VITAMIN C) 1000 MG tablet, Take 1,000 mg by mouth daily., Disp: , Rfl:    atorvastatin (LIPITOR) 40 MG tablet, Take 1 tablet (40 mg total) by mouth daily., Disp: 90 tablet,  Rfl: 1   Cholecalciferol (VITAMIN D) 2000 UNITS tablet, Take 1 tablet (2,000 Units total) by mouth daily., Disp: 90 tablet, Rfl: 1   ferrous sulfate 325 (65 FE) MG tablet, Take 325 mg by mouth daily with breakfast., Disp: , Rfl:    fluticasone (FLONASE) 50 MCG/ACT nasal spray, Place into both nostrils daily., Disp: , Rfl:    metFORMIN (GLUCOPHAGE) 500 MG tablet, Take 1 tablet (500 mg total) by mouth 2 (two) times daily with a meal., Disp: 180 tablet, Rfl: 1   methocarbamol (ROBAXIN) 500 MG tablet, Take 1 tablet (500 mg total) by mouth every 8 (eight) hours as needed for muscle spasms., Disp: 20 tablet, Rfl: 0   Vitamin D, Ergocalciferol, (DRISDOL) 1.25 MG (50000 UNIT) CAPS capsule, TAKE 1 CAPSULE BY MOUTH EVERY 7 DAYS FOR 12 DOSES, Disp: 12 capsule, Rfl: 0  Review of Systems:  Negative unless indicated in HPI.   Physical Exam: Vitals:   07/27/22 1322  BP: 136/68  Pulse: 70  Temp: 97.8 F (36.6 C)  TempSrc: Oral  SpO2: 99%  Weight: 151 lb 6.4 oz (68.7 kg)  Height: 5' 5.5" (1.664 m)    Body mass index is 24.81 kg/m.   Physical Exam Vitals reviewed.  Constitutional:      Appearance: Normal appearance.  HENT:     Head: Normocephalic and atraumatic.  Eyes:     Conjunctiva/sclera: Conjunctivae normal.     Pupils: Pupils are equal, round, and reactive to light.  Cardiovascular:     Rate and Rhythm: Normal rate and regular rhythm.  Pulmonary:     Effort: Pulmonary effort is normal.     Breath sounds: Normal breath sounds.  Skin:    General: Skin is warm and dry.  Neurological:     General: No focal deficit present.     Mental Status: She is alert and oriented to person, place, and time.  Psychiatric:        Mood and Affect: Mood normal.        Behavior: Behavior normal.        Thought Content: Thought content normal.        Judgment: Judgment normal.      Impression and Plan:  Type 2 diabetes mellitus without complication, without long-term current use of insulin  (HCC) - Plan: POC HgB A1c  B12 deficiency - Plan: cyanocobalamin (VITAMIN B12) injection 1,000 mcg  Hyperlipidemia associated with type 2 diabetes mellitus (HCC)  Vitamin D deficiency  -A1c of 6.2 demonstrates excellent diabetic management. -Blood pressure is well-controlled. -She is due to have a repeat lipid panel after being started on atorvastatin 40 mg for an LDL of 152.  She tells me that her endocrinologist has scheduled her for blood work next week and would prefer to have it done there. -B12 injection given in office today.  Time spent:33 minutes reviewing chart, interviewing and examining patient and formulating plan of care.     Yvonne Frohlich, MD [facility] at [facility]

## 2022-08-01 LAB — LAB REPORT - SCANNED
A1c: 6.4
Albumin, Urine POC: 7.7
Creatinine, POC: 176.4 mg/dL
EGFR: 93
Microalb Creat Ratio: 4

## 2022-08-24 ENCOUNTER — Ambulatory Visit (INDEPENDENT_AMBULATORY_CARE_PROVIDER_SITE_OTHER): Payer: 59 | Admitting: *Deleted

## 2022-08-24 DIAGNOSIS — E538 Deficiency of other specified B group vitamins: Secondary | ICD-10-CM | POA: Diagnosis not present

## 2022-08-24 MED ORDER — CYANOCOBALAMIN 1000 MCG/ML IJ SOLN
1000.0000 ug | Freq: Once | INTRAMUSCULAR | Status: AC
  Administered 2022-08-24: 1000 ug via INTRAMUSCULAR

## 2022-08-24 NOTE — Progress Notes (Signed)
Per orders of Cory Nafziger NP, injection of B12 given by Sharmila Wrobleski. Patient tolerated injection well. 

## 2022-08-25 ENCOUNTER — Ambulatory Visit: Payer: 59

## 2022-09-25 ENCOUNTER — Ambulatory Visit (INDEPENDENT_AMBULATORY_CARE_PROVIDER_SITE_OTHER): Payer: 59

## 2022-09-25 DIAGNOSIS — E538 Deficiency of other specified B group vitamins: Secondary | ICD-10-CM

## 2022-09-25 MED ORDER — CYANOCOBALAMIN 1000 MCG/ML IJ SOLN
1000.0000 ug | Freq: Once | INTRAMUSCULAR | Status: AC
  Administered 2022-09-25: 1000 ug via INTRAMUSCULAR

## 2022-09-25 NOTE — Progress Notes (Signed)
Per orders of Dr. Ardyth Harps, injection of Cyanocobalamin 1000 mcg/mL given by Vickii Chafe on R deltoid.  Patient tolerated injection well.   Pt is scheduled for next injection on Oct 25, 2022.

## 2022-10-25 ENCOUNTER — Ambulatory Visit (INDEPENDENT_AMBULATORY_CARE_PROVIDER_SITE_OTHER): Payer: 59

## 2022-10-25 ENCOUNTER — Encounter: Payer: Self-pay | Admitting: Gastroenterology

## 2022-10-25 DIAGNOSIS — E538 Deficiency of other specified B group vitamins: Secondary | ICD-10-CM | POA: Diagnosis not present

## 2022-10-25 MED ORDER — CYANOCOBALAMIN 1000 MCG/ML IJ SOLN
1000.0000 ug | Freq: Once | INTRAMUSCULAR | Status: AC
  Administered 2022-10-25: 1000 ug via INTRAMUSCULAR

## 2022-10-25 NOTE — Progress Notes (Signed)
Per orders of Dr. Hernandez, injection of Cyanocobalamin 1000 mcg given by Briyan Kleven L Adryen Cookson. Patient tolerated injection well.  

## 2022-11-07 ENCOUNTER — Telehealth: Payer: Self-pay | Admitting: Internal Medicine

## 2022-11-07 MED ORDER — "BD SAFETYGLIDE SYRINGE/NEEDLE 25G X 1"" 3 ML MISC": 3 refills | Status: DC

## 2022-11-07 MED ORDER — CYANOCOBALAMIN 1000 MCG/ML IJ SOLN: 1000.0000 ug | INTRAMUSCULAR | 11 refills | Status: DC

## 2022-11-07 NOTE — Telephone Encounter (Signed)
Patient wants to do her B-12 shots at home. Requests prescription and supplies to be able to do so.

## 2022-11-07 NOTE — Telephone Encounter (Signed)
Rx sent 

## 2022-11-23 ENCOUNTER — Other Ambulatory Visit: Payer: Self-pay | Admitting: Internal Medicine

## 2022-11-23 DIAGNOSIS — E538 Deficiency of other specified B group vitamins: Secondary | ICD-10-CM

## 2022-11-23 DIAGNOSIS — E1169 Type 2 diabetes mellitus with other specified complication: Secondary | ICD-10-CM

## 2022-11-24 ENCOUNTER — Ambulatory Visit: Payer: 59

## 2022-11-24 ENCOUNTER — Other Ambulatory Visit (INDEPENDENT_AMBULATORY_CARE_PROVIDER_SITE_OTHER): Payer: 59

## 2022-11-24 DIAGNOSIS — E538 Deficiency of other specified B group vitamins: Secondary | ICD-10-CM | POA: Diagnosis not present

## 2022-11-24 DIAGNOSIS — E785 Hyperlipidemia, unspecified: Secondary | ICD-10-CM | POA: Diagnosis not present

## 2022-11-24 DIAGNOSIS — E1169 Type 2 diabetes mellitus with other specified complication: Secondary | ICD-10-CM

## 2022-11-24 LAB — LIPID PANEL
Cholesterol: 160 mg/dL (ref 0–200)
HDL: 81 mg/dL (ref >39.00)
LDL Cholesterol: 70 mg/dL (ref 0–99)
NonHDL: 78.8
Total CHOL/HDL Ratio: 2
Triglycerides: 43 mg/dL (ref 0.0–149.0)
VLDL: 8.6 mg/dL (ref 0.0–40.0)

## 2022-11-24 LAB — HEMOGLOBIN A1C: Hgb A1c MFr Bld: 6.5 % (ref 4.6–6.5)

## 2022-11-24 LAB — VITAMIN B12: Vitamin B-12: 568 pg/mL (ref 211–911)

## 2022-12-04 ENCOUNTER — Telehealth: Payer: Self-pay | Admitting: Internal Medicine

## 2022-12-04 NOTE — Telephone Encounter (Signed)
Pt is calling and would like blood work result 

## 2022-12-05 NOTE — Telephone Encounter (Signed)
Patient is aware.  Note sent via mychart per patient request.

## 2022-12-05 NOTE — Telephone Encounter (Signed)
Patient would like to know if she can start taking oral B12?

## 2022-12-19 ENCOUNTER — Ambulatory Visit (INDEPENDENT_AMBULATORY_CARE_PROVIDER_SITE_OTHER): Payer: 59 | Admitting: Family Medicine

## 2022-12-19 ENCOUNTER — Encounter: Payer: Self-pay | Admitting: Family Medicine

## 2022-12-19 VITALS — BP 130/66 | HR 70 | Temp 98.4°F | Ht 65.5 in | Wt 157.1 lb

## 2022-12-19 DIAGNOSIS — J019 Acute sinusitis, unspecified: Secondary | ICD-10-CM

## 2022-12-19 MED ORDER — AMOXICILLIN-POT CLAVULANATE 875-125 MG PO TABS: 1.0000 | ORAL_TABLET | Freq: Two times a day (BID) | ORAL | 0 refills | Status: DC

## 2022-12-19 NOTE — Progress Notes (Signed)
Established Patient Office Visit  Subjective   Patient ID: Yvonne Marshall, female    DOB: [birth year]  Age: 67 y.o. MRN: [id number]  Chief Complaint  Patient presents with   Sinusitis    HPI   Jerome is seen with approximately 1 month history of left cheek pressure, pressure behind both eyes, intermittent headaches, rare cough, frequent postnasal drip symptoms.  She does have some nasal congestion.  She has tried Flonase for a few weeks without relief.  Denies any fever.  She has controlled type 2 diabetes.  Past Medical History:  Diagnosis Date   Allergic rhinitis    Anemia, mild    Anxiety    Asthma, mild    Chest pain, atypical    Degenerative joint disease    Diabetes mellitus without complication (HCC)    GERD (gastroesophageal reflux disease)    Hemorrhoids    Hyperlipidemia    IBS (irritable bowel syndrome)    Past Surgical History:  Procedure Laterality Date   EXPLORATORY LAPAROTOMY     for adhesions   FINGER SURGERY     HYSTEROSCOPY  1998   ROTATOR CUFF REPAIR Left 1990    reports that she has never smoked. She has never used smokeless tobacco. She reports current alcohol use of about 3.0 - 4.0 standard drinks of alcohol per week. She reports that she does not use drugs. family history includes Alcohol abuse in her father; Breast cancer (age of onset: 46 - 25) in her maternal grandmother; Cancer in an other family member; Diabetes in her mother; Heart failure in her mother. No Known Allergies  Review of Systems  Constitutional:  Negative for chills and fever.  HENT:  Positive for congestion and sinus pain. Negative for ear pain.   Respiratory:  Positive for cough. Negative for shortness of breath and wheezing.   Neurological:  Positive for headaches.      Objective:     BP 130/66 (BP Location: Left Arm, Patient Position: Sitting, Cuff Size: Normal)   Pulse 70   Temp 98.4 F (36.9 C) (Oral)   Ht 5' 5.5" (1.664 m)   Wt 157 lb 1.6 oz (71.3 kg)   SpO2  99%   BMI 25.75 kg/m  BP Readings from Last 3 Encounters:  12/19/22 130/66  07/27/22 136/68  04/04/22 120/78   Wt Readings from Last 3 Encounters:  12/19/22 157 lb 1.6 oz (71.3 kg)  07/27/22 151 lb 6.4 oz (68.7 kg)  04/04/22 152 lb 12.8 oz (69.3 kg)      Physical Exam Vitals reviewed.  Constitutional:      General: She is not in acute distress.    Appearance: She is not toxic-appearing.  HENT:     Right Ear: Tympanic membrane normal.     Left Ear: Tympanic membrane normal.     Mouth/Throat:     Mouth: Mucous membranes are moist.     Pharynx: No oropharyngeal exudate or posterior oropharyngeal erythema.  Cardiovascular:     Rate and Rhythm: Normal rate and regular rhythm.  Pulmonary:     Effort: Pulmonary effort is normal.     Breath sounds: Normal breath sounds.  Neurological:     Mental Status: She is alert.      No results found for any visits on 12/19/22.    The ASCVD Risk score (Arnett DK, et al., 2019) failed to calculate for the following reasons:   Unable to determine if patient is Non-Hispanic African American    Assessment &  Plan:   Acute sinusitis.  Patient has been battling frontal and left maxillary sinus symptoms for over a month.  No relief with over-the-counter medications such as Flonase.  Start Augmentin 875 mg twice daily with food for 10 days.  Stay well-hydrated.  Follow-up for any persistent or worsening symptoms.   Evelena Peat, MD

## 2023-01-19 ENCOUNTER — Encounter: Payer: Self-pay | Admitting: Family Medicine

## 2023-01-19 ENCOUNTER — Ambulatory Visit: Payer: 59 | Admitting: Family Medicine

## 2023-01-19 VITALS — BP 128/80 | HR 63 | Temp 98.0°F | Ht 65.5 in | Wt 154.5 lb

## 2023-01-19 DIAGNOSIS — R519 Headache, unspecified: Secondary | ICD-10-CM

## 2023-01-19 DIAGNOSIS — J339 Nasal polyp, unspecified: Secondary | ICD-10-CM | POA: Diagnosis not present

## 2023-01-19 NOTE — Progress Notes (Signed)
Established Patient Office Visit  Subjective   Patient ID: Yvonne Marshall, female    DOB: 13-May-1956  Age: 67 y.o. MRN: [id number]  Chief Complaint  Patient presents with   Referral    HPI   Avenell was recently seen with 1 month history of left facial pain around her maxillary sinus region.  We ended up treating her with Augmentin 875 mg twice daily for 10 days.  She may have had some slight improvement initially but now has left cheek pain again.  Some loss of taste.  She feels like she has increased sensitivity to smell.  Still complains of left cheek pressure.  She does relate remote history of nasal polyps which required surgery years ago.  She estimates this may have been 15 years ago.  No recent nasal bleeding.  No fever. She has lost a few pounds but she states this is intentional and due to diet.  Past Medical History:  Diagnosis Date   Allergic rhinitis    Anemia, mild    Anxiety    Asthma, mild    Chest pain, atypical    Degenerative joint disease    Diabetes mellitus without complication (HCC)    GERD (gastroesophageal reflux disease)    Hemorrhoids    Hyperlipidemia    IBS (irritable bowel syndrome)    Past Surgical History:  Procedure Laterality Date   EXPLORATORY LAPAROTOMY     for adhesions   FINGER SURGERY     HYSTEROSCOPY  1998   ROTATOR CUFF REPAIR Left 1990    reports that she has never smoked. She has never used smokeless tobacco. She reports current alcohol use of about 3.0 - 4.0 standard drinks of alcohol per week. She reports that she does not use drugs. family history includes Alcohol abuse in her father; Breast cancer (age of onset: 11 - 69) in her maternal grandmother; Cancer in an other family member; Diabetes in her mother; Heart failure in her mother. No Known Allergies  Review of Systems  Constitutional:  Negative for chills and fever.  HENT:  Positive for sinus pain. Negative for hearing loss and sore throat.   Respiratory:  Negative for  cough.   Neurological:  Negative for dizziness.      Objective:     BP 128/80 (BP Location: Left Arm, Patient Position: Sitting, Cuff Size: Normal)   Pulse 63   Temp 98 F (36.7 C) (Oral)   Ht 5' 5.5" (1.664 m)   Wt 154 lb 8 oz (70.1 kg)   SpO2 99%   BMI 25.32 kg/m    Physical Exam Vitals reviewed.  Constitutional:      General: She is not in acute distress.    Appearance: Normal appearance.  HENT:     Right Ear: Tympanic membrane normal.     Left Ear: Tympanic membrane normal.     Nose:     Comments: Does have a benign-appearing polyp in the left nasal cavity.  None noted on the right.  Otherwise nasal exam unremarkable.  No purulent discharge noted.  No bloody discharge. Cardiovascular:     Rate and Rhythm: Normal rate and regular rhythm.  Neurological:     Mental Status: She is alert.      No results found for any visits on 01/19/23.    The ASCVD Risk score (Arnett DK, et al., 2019) failed to calculate for the following reasons:   Unable to determine if patient is Non-Hispanic African American    Assessment &  Plan:   Problem List Items Addressed This Visit   None Visit Diagnoses     Left facial pain    -  Primary   Nasal polyp       Relevant Orders   Ambulatory referral to ENT     Patient is seen with persistent left maxillary pain-not much improved following recent treatment with Augmentin.  History of nasal polyposis with remote history of sinus surgery. Does not have any recent nasal bleeding, purulent discharge, or fever  -Set up ENT referral. -We also mention option of limited maxillofacial CT of sinuses if she cannot get into see ENT within the next couple weeks or so.  No follow-ups on file.    Evelena Peat, MD

## 2023-01-19 NOTE — Patient Instructions (Signed)
I am setting up ENT referral.  Let me know if they can't get you in < one month.   We might need to consider CT scan to evaluate sinuses.

## 2023-01-23 ENCOUNTER — Telehealth: Payer: Self-pay | Admitting: Internal Medicine

## 2023-01-23 NOTE — Telephone Encounter (Signed)
Left a message for the patient to return my call.  

## 2023-01-23 NOTE — Telephone Encounter (Signed)
Patient informed of the message below and voiced understanding. Message sent via Mychart per pt request.

## 2023-01-23 NOTE — Telephone Encounter (Signed)
Pt askng what pain reliever she can take for the facial pain discussed in her appointment on 01/19/23

## 2023-01-28 IMAGING — MG DIGITAL SCREENING BILAT W/ CAD
5 series · 5 of 5 positions shown · non-contrast
Comparison: Previous exam(s).

CLINICAL DATA: Screening.

EXAM:
DIGITAL SCREENING BILATERAL MAMMOGRAM WITH CAD
TECHNIQUE: Bilateral screening digital craniocaudal and mediolateral oblique
mammograms were obtained. The images were evaluated with
computer-aided detection.

[L CC]
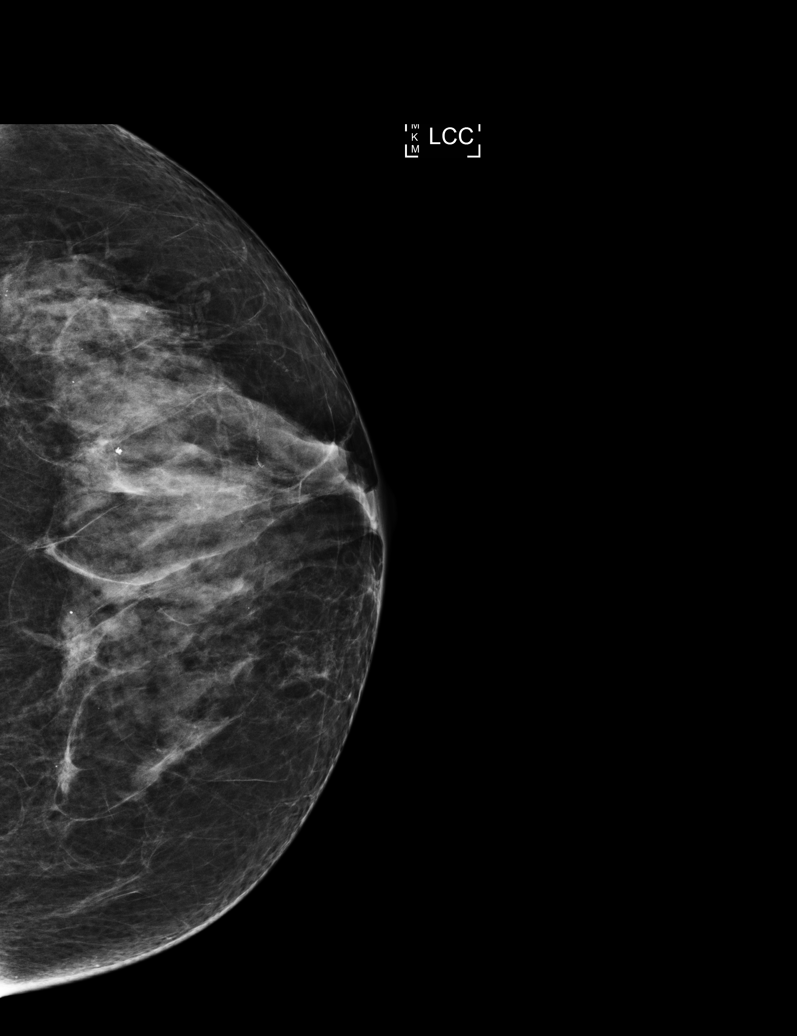

[R CC]
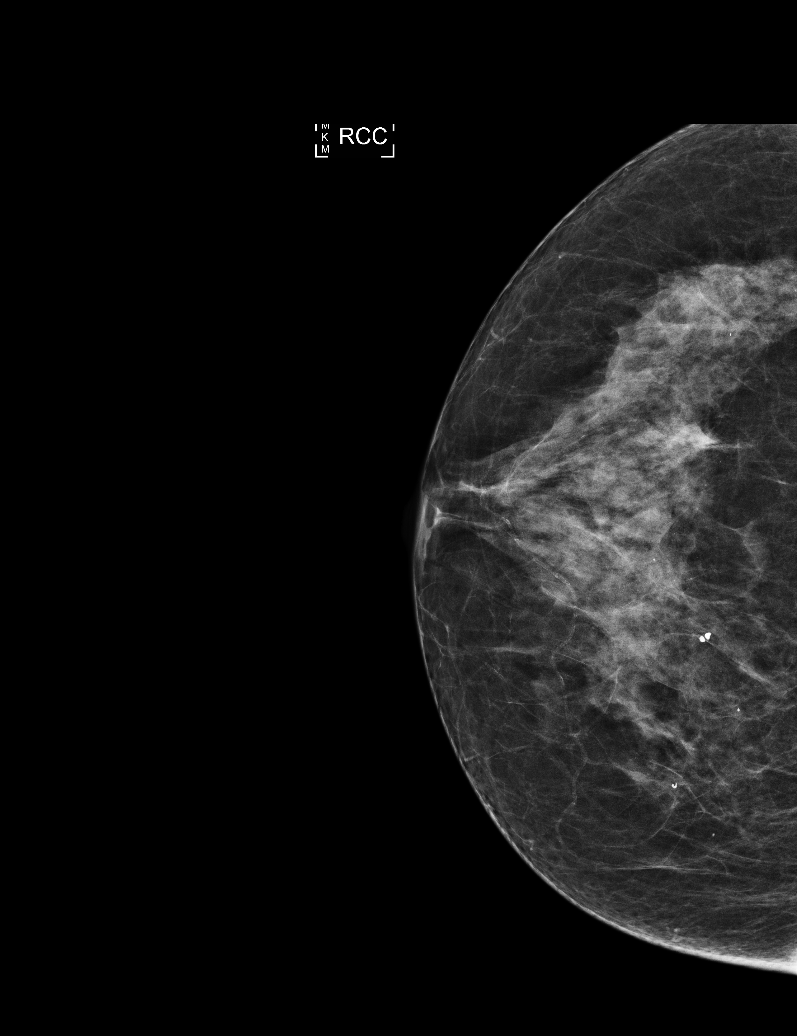

[R MLO (1 of 2)]
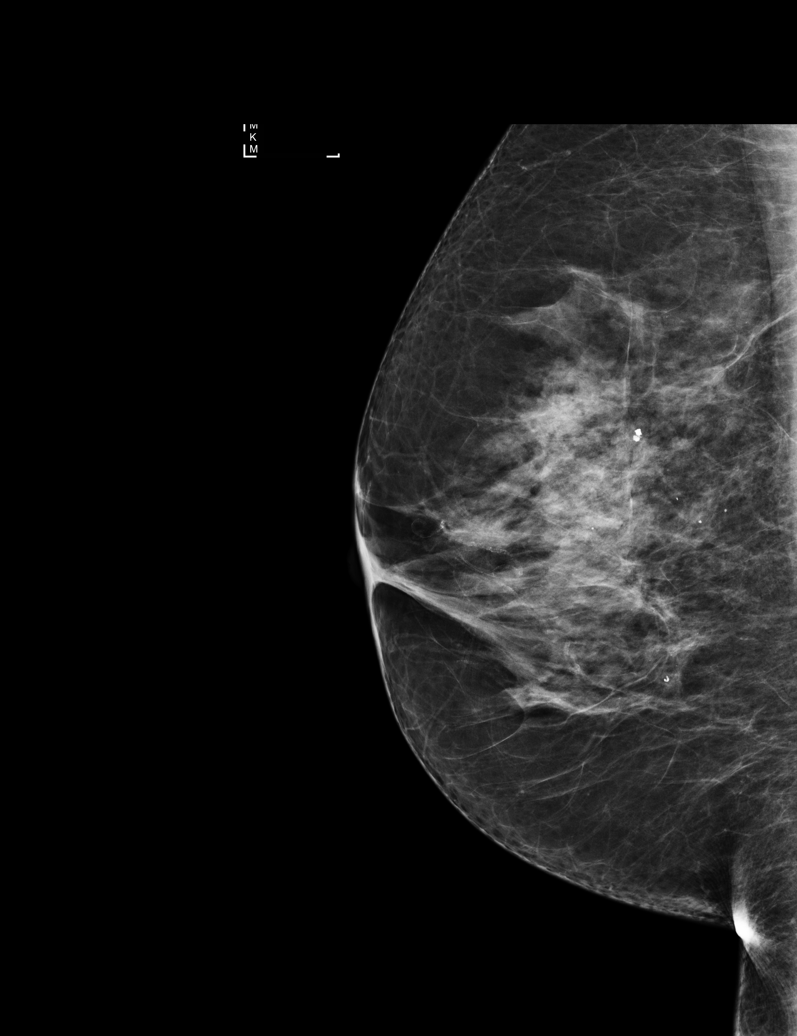

[R MLO (2 of 2)]
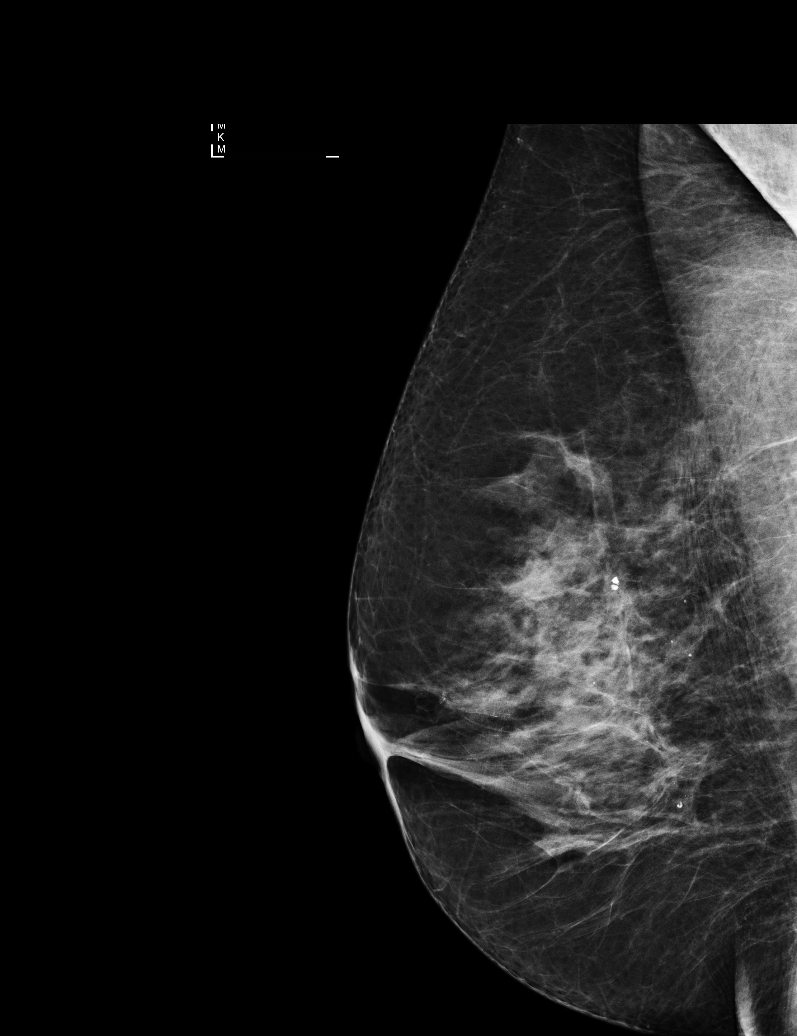

[L MLO]
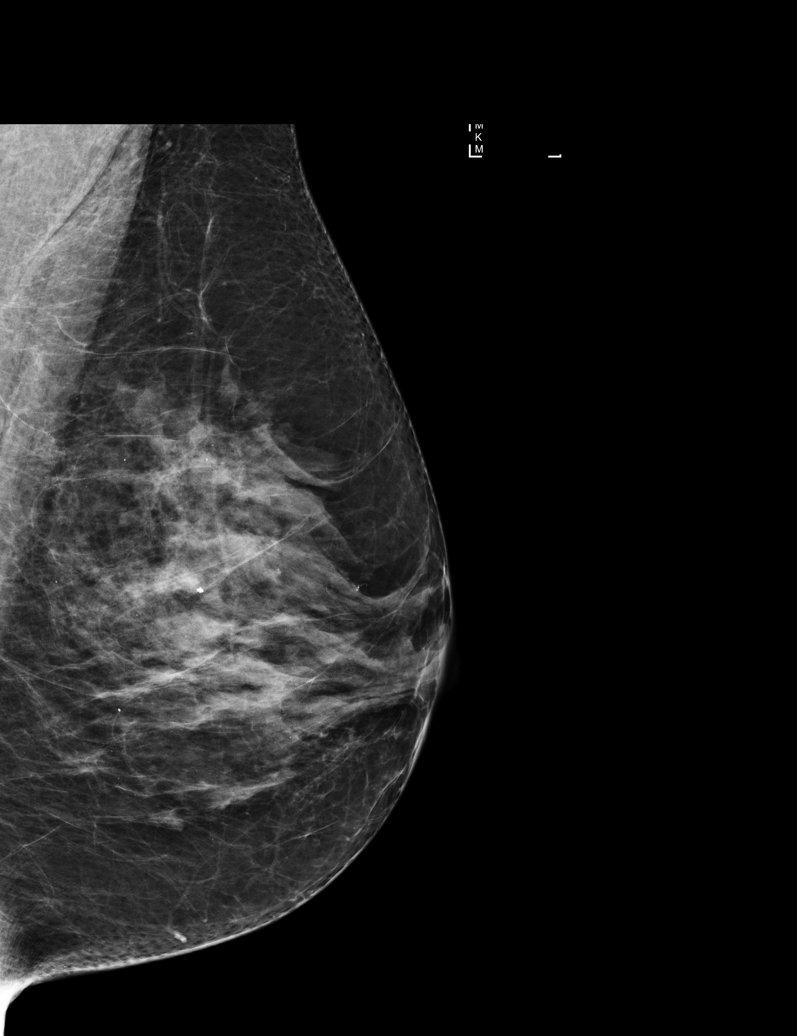

[5 of 5 positions shown; findings below may reference images not displayed]

ACR Breast Density Category c: The breast tissue is heterogeneously
dense, which may obscure small masses.
FINDINGS: There are no findings suspicious for malignancy.
IMPRESSION: No mammographic evidence of malignancy. A result letter of this
screening mammogram will be mailed directly to the patient.

RECOMMENDATION:
Screening mammogram in one year. (Code:TJ-B-ZPA)

BI-RADS CATEGORY  1: Negative.

## 2023-02-06 ENCOUNTER — Telehealth: Payer: Self-pay | Admitting: Internal Medicine

## 2023-02-06 NOTE — Telephone Encounter (Signed)
Pt called to say she received a bill for Date of Service: 10/25/22.  Pt states the bill is asking for $10 and $130, for shots received on that day.   Pt says she only gets B-12 shots.  Pt would like an explanation.   There are no outstanding balances on Pt's chart, from what I can see.   Please return Pt's call at your earliest convenience.

## 2023-02-07 NOTE — Telephone Encounter (Signed)
Spoke to patient and gave her the telephone number to billing.

## 2023-02-26 ENCOUNTER — Ambulatory Visit (AMBULATORY_SURGERY_CENTER): Payer: 59

## 2023-02-26 ENCOUNTER — Encounter: Payer: Self-pay | Admitting: Gastroenterology

## 2023-02-26 VITALS — Ht 66.0 in | Wt 155.0 lb

## 2023-02-26 DIAGNOSIS — Z1211 Encounter for screening for malignant neoplasm of colon: Secondary | ICD-10-CM

## 2023-02-26 MED ORDER — NA SULFATE-K SULFATE-MG SULF 17.5-3.13-1.6 GM/177ML PO SOLN: 1.0000 | Freq: Once | ORAL | 0 refills | Status: AC

## 2023-02-26 NOTE — Progress Notes (Signed)
No egg or soy allergy known to patient  No issues known to pt with past sedation with any surgeries or procedures Patient denies ever being told they had issues or difficulty with intubation  No FH of Malignant Hyperthermia Pt is not on diet pills Pt is not on  home 02  Pt is not on blood thinners  Pt denies issues with constipation  No A fib or A flutter Have any cardiac testing pending--no  LOA: independent  Prep: suprep   Patient's chart reviewed by Yvonne Marshall CNRA prior to previsit and patient appropriate for the [facility].  Previsit completed and red dot placed by patient's name on their procedure day (on provider's schedule).     PV competed with patient. Prep instructions sent via mychart and home address. Goodrx coupon for [facility] provided to use for price reduction if needed.   

## 2023-03-13 ENCOUNTER — Other Ambulatory Visit (HOSPITAL_COMMUNITY)
Admission: RE | Admit: 2023-03-13 | Discharge: 2023-03-13 | Disposition: A | Discharge status: Home / Self Care | Payer: 59 | Source: Ambulatory Visit | Attending: Obstetrics and Gynecology | Admitting: Obstetrics and Gynecology

## 2023-03-13 ENCOUNTER — Encounter: Payer: Self-pay | Admitting: Obstetrics and Gynecology

## 2023-03-13 ENCOUNTER — Ambulatory Visit (INDEPENDENT_AMBULATORY_CARE_PROVIDER_SITE_OTHER): Payer: 59 | Admitting: Obstetrics and Gynecology

## 2023-03-13 VITALS — BP 122/74 | HR 65 | Ht 64.0 in | Wt 156.0 lb

## 2023-03-13 DIAGNOSIS — Z01419 Encounter for gynecological examination (general) (routine) without abnormal findings: Secondary | ICD-10-CM | POA: Insufficient documentation

## 2023-03-13 DIAGNOSIS — Z124 Encounter for screening for malignant neoplasm of cervix: Secondary | ICD-10-CM

## 2023-03-13 DIAGNOSIS — Z13820 Encounter for screening for osteoporosis: Secondary | ICD-10-CM

## 2023-03-13 DIAGNOSIS — N644 Mastodynia: Secondary | ICD-10-CM | POA: Insufficient documentation

## 2023-03-13 DIAGNOSIS — R102 Pelvic and perineal pain unspecified side: Secondary | ICD-10-CM | POA: Insufficient documentation

## 2023-03-13 NOTE — Assessment & Plan Note (Signed)
Subacute mild, LLQ pain, will r/o pelvic etiology

## 2023-03-13 NOTE — Assessment & Plan Note (Signed)
Cervical cancer screening performed according to ASCCP guidelines. Encouraged annual mammogram screening Colonoscopy scheduled DXA ordered Labs and immunizations with her primary Encouraged safe sexual practices as indicated Encouraged healthy lifestyle practices with diet and exercise

## 2023-03-13 NOTE — Progress Notes (Signed)
67 y.o. G0P0000 female here for annual exam. Works as a Merchandiser, retail. Single.  No LMP recorded. Patient is postmenopausal.  Abnormal bleeding: none Pelvic discharge or pain: yes, notes intermittent, aching left pelvic tenderness over couple months. Worse 5/10, today 3/10. Not daily. Has not needed to take medications for pain. Breast mass, nipple discharge or skin changes : none  Last PAP: none Last mammogram: 11/06/20263 Last colonoscopy: scheduled 03/21/23 Sexually active: no  Exercising: walks 2x/wk, couple miles per walk  GYN HISTORY: No significant hx  OB History  Gravida Para Term Preterm AB Living  0 0 0 0 0 0  SAB IAB Ectopic Multiple Live Births  0 0 0 0 0    Past Medical History:  Diagnosis Date   Allergic rhinitis    Anemia, mild    Anxiety    Asthma, mild    Chest pain, atypical    Degenerative joint disease    Diabetes mellitus without complication (HCC)    GERD (gastroesophageal reflux disease)    Hemorrhoids    Hyperlipidemia    IBS (irritable bowel syndrome)     Past Surgical History:  Procedure Laterality Date   FINGER SURGERY     HYSTEROSCOPY  1998   PELVIC LAPAROSCOPY     cystectomy, 1990s   ROTATOR CUFF REPAIR Left 1990    Current Outpatient Medications on File Prior to Visit  Medication Sig Dispense Refill   albuterol (VENTOLIN HFA) 108 (90 Base) MCG/ACT inhaler Inhale 2 puffs into the lungs every 4 (four) hours as needed for wheezing or shortness of breath. 1 each 0   atorvastatin (LIPITOR) 40 MG tablet Take 1 tablet (40 mg total) by mouth daily. 90 tablet 1   Cholecalciferol (VITAMIN D) 2000 UNITS tablet Take 1 tablet (2,000 Units total) by mouth daily. 90 tablet 1   Cyanocobalamin (VITAMIN B-12 PO) Take 1 tablet by mouth daily.     metFORMIN (GLUCOPHAGE) 500 MG tablet Take 1 tablet (500 mg total) by mouth 2 (two) times daily with a meal. 180 tablet 1   SYRINGE-NEEDLE, DISP, 3 ML (BD SAFETYGLIDE SYRINGE/NEEDLE) 25G X 1" 3 ML MISC Use  for B12 injections 100 each 3   No current facility-administered medications on file prior to visit.    Social History   Socioeconomic History   Marital status: Single    Spouse name: Not on file   Number of children: Not on file   Years of education: Not on file   Highest education level: Not on file  Occupational History   Occupation: works w/ computers  Tobacco Use   Smoking status: Never   Smokeless tobacco: Never  Vaping Use   Vaping status: Never Used  Substance and Sexual Activity   Alcohol use: Yes    Alcohol/week: 3.0 - 4.0 standard drinks of alcohol    Types: 3 - 4 Cans of beer per week   Drug use: No   Sexual activity: Not Currently    Birth control/protection: Post-menopausal  Other Topics Concern   Not on file  Social History Narrative   Not on file   Social Determinants of Health   Financial Resource Strain: Patient Declined (01/18/2023)   Overall Financial Resource Strain (CARDIA)    Difficulty of Paying Living Expenses: Patient declined  Food Insecurity: Low Risk  (02/21/2023)   Received from [facility]   Hunger Vital Sign    Worried About Running Out of Food in the Last Year: Never true  Ran Out of Food in the Last Year: Never true  Transportation Needs: No Transportation Needs (02/21/2023)   Received from [facility]   Transportation    In the past 12 months, has lack of reliable transportation kept you from medical appointments, meetings, work or from getting things needed for daily living? : No  Physical Activity: Insufficiently Active (01/18/2023)   Exercise Vital Sign    Days of Exercise per Week: 2 days    Minutes of Exercise per Session: 20 min  Stress: Stress Concern Present (01/18/2023)   [employer] of Occupational Health - Occupational Stress Questionnaire    Feeling of Stress : To some extent  Social Connections: Socially Isolated (01/18/2023)   Social Connection and Isolation Panel [NHANES]    Frequency of Communication with  Friends and Family: Three times a week    Frequency of Social Gatherings with Friends and Family: Once a week    Attends Religious Services: Never    Database administrator or Organizations: No    Attends Engineer, structural: Not on file    Marital Status: Never married  Catering manager Violence: Not on file    Family History  Problem Relation Age of Onset   Heart failure Mother    Diabetes Mother    Alcohol abuse Father    Lymphoma Sister    Lung cancer Brother    Diabetes Brother    Diabetes Brother    Breast cancer Maternal Grandmother 60 - 69   Colon cancer Neg Hx    Esophageal cancer Neg Hx    Rectal cancer Neg Hx    Stomach cancer Neg Hx    Ovarian cancer Neg Hx     No Known Allergies    PE Today's Vitals   03/13/23 1123  BP: 122/74  Pulse: 65  SpO2: 100%  Weight: 156 lb (70.8 kg)  Height: 5\' 4"  (1.626 m)   Body mass index is 26.78 kg/m.  Physical Exam Vitals reviewed. Exam conducted with a chaperone present.  Constitutional:      General: She is not in acute distress.    Appearance: Normal appearance.  HENT:     Head: Normocephalic and atraumatic.     Nose: Nose normal.  Eyes:     Extraocular Movements: Extraocular movements intact.     Conjunctiva/sclera: Conjunctivae normal.  Neck:     Thyroid: No thyroid mass, thyromegaly or thyroid tenderness.  Pulmonary:     Effort: Pulmonary effort is normal.  Chest:     Chest wall: No mass or tenderness.  Breasts:    Right: Normal. No swelling, mass, nipple discharge, skin change or tenderness.     Left: Normal. No swelling, mass, nipple discharge, skin change or tenderness.  Abdominal:     General: There is no distension.     Palpations: Abdomen is soft.     Tenderness: There is no abdominal tenderness.  Genitourinary:    General: Normal vulva.     Exam position: Lithotomy position.     Urethra: No prolapse.     Vagina: Normal. No vaginal discharge or bleeding.     Cervix: Normal. No  lesion.     Uterus: Normal. Not enlarged and not tender.      Adnexa: Right adnexa normal and left adnexa normal.  Musculoskeletal:        General: Normal range of motion.     Cervical back: Normal range of motion.  Lymphadenopathy:     Upper Body:  Right upper body: No axillary adenopathy.     Left upper body: No axillary adenopathy.     Lower Body: No right inguinal adenopathy. No left inguinal adenopathy.  Skin:    General: Skin is warm and dry.  Neurological:     General: No focal deficit present.     Mental Status: She is alert.  Psychiatric:        Mood and Affect: Mood normal.        Behavior: Behavior normal.       Assessment and Plan:        Pelvic pain Assessment & Plan: Subacute mild, LLQ pain, will r/o pelvic etiology  Orders: -     US PELVIS TRANSVAGINAL NON-OB (TV ONLY); Future  Breast pain, left -     MM 3D DIAGNOSTIC MAMMOGRAM UNILATERAL LEFT BREAST; Future -     [place] LIMITED ULTRASOUND INCLUDING AXILLA LEFT BREAST ; Future  Osteoporosis screening -     DG Bone Density; Future  Well woman exam with routine gynecological exam Assessment & Plan: Cervical cancer screening performed according to ASCCP guidelines. Encouraged annual mammogram screening Colonoscopy scheduled DXA ordered Labs and immunizations with her primary Encouraged safe sexual practices as indicated Encouraged healthy lifestyle practices with diet and exercise    Cervical cancer screening -     Cytology - PAP    Shaunessy Dobratz Lasandra Beech, MD

## 2023-03-13 NOTE — Patient Instructions (Signed)
Health Maintenance, Female Adopting a healthy lifestyle and getting preventive care are important in promoting health and wellness. Ask your health care provider about: The right schedule for you to have regular tests and exams. Things you can do on your own to prevent diseases and keep yourself healthy. What should I know about diet, weight, and exercise? Eat a healthy diet  Eat a diet that includes plenty of vegetables, fruits, low-fat dairy products, and lean protein. Do not eat a lot of foods that are high in solid fats, added sugars, or sodium. Maintain a healthy weight Body mass index (BMI) is used to identify weight problems. It estimates body fat based on height and weight. Your health care provider can help determine your BMI and help you achieve or maintain a healthy weight. Get regular exercise Get regular exercise. This is one of the most important things you can do for your health. Most adults should: Exercise for at least 150 minutes each week. The exercise should increase your heart rate and make you sweat (moderate-intensity exercise). Do strengthening exercises at least twice a week. This is in addition to the moderate-intensity exercise. Spend less time sitting. Even light physical activity can be beneficial. Watch cholesterol and blood lipids Have your blood tested for lipids and cholesterol at 65 years of age, then have this test every 5 years. Have your cholesterol levels checked more often if: Your lipid or cholesterol levels are high. You are older than 65 years of age. You are at high risk for heart disease. What should I know about cancer screening? Depending on your health history and family history, you may need to have cancer screening at various ages. This may include screening for: Breast cancer. Cervical cancer. Colorectal cancer. Skin cancer. Lung cancer. What should I know about heart disease, diabetes, and high blood pressure? Blood pressure and heart  disease High blood pressure causes heart disease and increases the risk of stroke. This is more likely to develop in people who have high blood pressure readings or are overweight. Have your blood pressure checked: Every 3-5 years if you are 65-81 years of age. Every year if you are 65 years old or older. Diabetes Have regular diabetes screenings. This checks your fasting blood sugar level. Have the screening done: Once every three years after age 65 if you are at a normal weight and have a low risk for diabetes. More often and at a younger age if you are overweight or have a high risk for diabetes. What should I know about preventing infection? Hepatitis B If you have a higher risk for hepatitis B, you should be screened for this virus. Talk with your health care provider to find out if you are at risk for hepatitis B infection. Hepatitis C Testing is recommended for: Everyone born from [birth year] through 1965. Anyone with known risk factors for hepatitis C. Sexually transmitted infections (STIs) Get screened for STIs, including gonorrhea and chlamydia, if: You are sexually active and are younger than 65 years of age. You are older than 65 years of age and your health care provider tells you that you are at risk for this type of infection. Your sexual activity has changed since you were last screened, and you are at increased risk for chlamydia or gonorrhea. Ask your health care provider if you are at risk. Ask your health care provider about whether you are at high risk for HIV. Your health care provider may recommend a prescription medicine to help prevent HIV  infection. If you choose to take medicine to prevent HIV, you should first get tested for HIV. You should then be tested every 3 months for as long as you are taking the medicine. Osteoporosis and menopause Osteoporosis is a disease in which the bones lose minerals and strength with aging. This can result in bone fractures. If you are 65  years old or older, or if you are at risk for osteoporosis and fractures, ask your health care provider if you should: Be screened for bone loss. Take a calcium or vitamin D supplement to lower your risk of fractures. Be given hormone replacement therapy (HRT) to treat symptoms of menopause. Follow these instructions at home: Alcohol use Do not drink alcohol if: Your health care provider tells you not to drink. You are pregnant, may be pregnant, or are planning to become pregnant. If you drink alcohol: Limit how much you have to: 0-1 drink a day. Know how much alcohol is in your drink. In the [place] one drink equals one 12 oz bottle of beer (355 mL), one 5 oz glass of wine (148 mL), or one 1 oz glass of hard liquor (44 mL). Lifestyle Do not use any products that contain nicotine or tobacco. These products include cigarettes, chewing tobacco, and vaping devices, such as e-cigarettes. If you need help quitting, ask your health care provider. Do not use street drugs. Do not share needles. Ask your health care provider for help if you need support or information about quitting drugs. General instructions Schedule regular health, dental, and eye exams. Stay current with your vaccines. Tell your health care provider if: You often feel depressed. You have ever been abused or do not feel safe at home. Summary Adopting a healthy lifestyle and getting preventive care are important in promoting health and wellness. Follow your health care provider's instructions about healthy diet, exercising, and getting tested or screened for diseases. Follow your health care provider's instructions on monitoring your cholesterol and blood pressure. This information is not intended to replace advice given to you by your health care provider. Make sure you discuss any questions you have with your health care provider. Document Revised: 10/04/2020 Document Reviewed: 10/04/2020 Elsevier Patient Education  2024  [employer].

## 2023-03-19 ENCOUNTER — Telehealth: Payer: Self-pay | Admitting: Gastroenterology

## 2023-03-19 ENCOUNTER — Telehealth: Payer: Self-pay | Admitting: *Deleted

## 2023-03-19 LAB — CYTOLOGY - PAP
Comment: NEGATIVE
Diagnosis: NEGATIVE
High risk HPV: NEGATIVE

## 2023-03-19 NOTE — Telephone Encounter (Signed)
Cat scan of sinus and is concerned about this issue and anesthesia. Pt has not had results yet. Provided education on what type of medication used to do procedure and what she will experience after. Instructed pt to wait for results of scan and to make sure when they call with results that she remind them that  she has a colonoscopy coming. She wanted to let us know. Pt stated she is not having any issues with breathing waking in am or doing any activity. Pt says she expects a call back from her MD about results today. RN to send a not to [employer] to be aware of issue. Pt instructed that her Prep medication is good for a year if she has need or desires to cancel and reschedule. Pt states she will call if needed to reschedule . No other questions at this time

## 2023-03-19 NOTE — Telephone Encounter (Signed)
CRNA made aware

## 2023-03-19 NOTE — Telephone Encounter (Signed)
Inbound call from patient, states she is worried about sedation and would like to discuss with nurse.

## 2023-03-21 ENCOUNTER — Ambulatory Visit (AMBULATORY_SURGERY_CENTER): Payer: 59 | Admitting: Gastroenterology

## 2023-03-21 ENCOUNTER — Encounter: Payer: Self-pay | Admitting: Gastroenterology

## 2023-03-21 VITALS — BP 97/58 | HR 55 | Temp 98.0°F | Resp 10 | Ht 66.0 in | Wt 155.0 lb

## 2023-03-21 DIAGNOSIS — Z1211 Encounter for screening for malignant neoplasm of colon: Secondary | ICD-10-CM

## 2023-03-21 MED ORDER — SODIUM CHLORIDE 0.9 % IV SOLN: 500.0000 mL | Freq: Once | INTRAVENOUS | Status: DC

## 2023-03-21 NOTE — Op Note (Signed)
[facility] Patient Name: Yvonne Marshall Procedure Date: 03/21/2023 1:40 PM MRN: [id number] Endoscopist: Meryl Dare , MD, [phone] Age: 67 Referring MD:  Date of Birth: [birth year] Gender: Female Account #: [id number] Procedure:                Colonoscopy Indications:              Screening for colorectal malignant neoplasm Medicines:                Monitored Anesthesia Care Procedure:                Pre-Anesthesia Assessment:                           - Prior to the procedure, a History and Physical                            was performed, and patient medications and                            allergies were reviewed. The patient's tolerance of                            previous anesthesia was also reviewed. The risks                            and benefits of the procedure and the sedation                            options and risks were discussed with the patient.                            All questions were answered, and informed consent                            was obtained. Prior Anticoagulants: The patient has                            taken no anticoagulant or antiplatelet agents. ASA                            Grade Assessment: II - A patient with mild systemic                            disease. After reviewing the risks and benefits,                            the patient was deemed in satisfactory condition to                            undergo the procedure.                           After obtaining informed consent, the colonoscope  was passed under direct vision. Throughout the                            procedure, the patient's blood pressure, pulse, and                            oxygen saturations were monitored continuously. The                            CF [id number] #[id number] was introduced through the anus                            and advanced to the the cecum, identified by                            appendiceal  orifice and ileocecal valve. The                            ileocecal valve, appendiceal orifice, and rectum                            were photographed. The quality of the bowel                            preparation was good. The colonoscopy was performed                            without difficulty. The patient tolerated the                            procedure well. Scope In: 1:45:11 PM Scope Out: 1:59:21 PM Scope Withdrawal Time: 0 hours 9 minutes 38 seconds  Total Procedure Duration: 0 hours 14 minutes 10 seconds  Findings:                 The perianal and digital rectal examinations were                            normal.                           Multiple medium-mouthed and small-mouthed                            diverticula were found in the left colon. There was                            no evidence of diverticular bleeding.                           External and internal hemorrhoids were found during                            retroflexion. The hemorrhoids were small and Grade  I (internal hemorrhoids that do not prolapse).                           The exam was otherwise without abnormality on                            direct and retroflexion views. Complications:            No immediate complications. Estimated blood loss:                            None. Estimated Blood Loss:     Estimated blood loss: none. Impression:               - Mild diverticulosis in the left colon.                           - External and internal hemorrhoids.                           - The examination was otherwise normal on direct                            and retroflexion views.                           - No specimens collected. Recommendation:           - Repeat colonoscopy in 10 years for screening                            purposes.                           - Patient has a contact number available for                            emergencies. The signs and  symptoms of potential                            delayed complications were discussed with the                            patient. Return to normal activities tomorrow.                            Written discharge instructions were provided to the                            patient.                           - Resume previous diet adding high fiber.                           - Continue present medications. Meryl Dare, MD 03/21/2023 2:04:05 PM This report has been signed electronically.

## 2023-03-21 NOTE — Progress Notes (Signed)
[name]  Pt's states no medical or surgical changes since previsit or office visit.

## 2023-03-21 NOTE — Progress Notes (Signed)
See 03/13/2023 H&P no changes

## 2023-03-21 NOTE — Patient Instructions (Signed)
**  Handouts given on Diverticulosis and Hemorrhoids**   YOU HAD AN ENDOSCOPIC PROCEDURE TODAY AT THE [facility]:   Refer to the procedure report that was given to you for any specific questions about what was found during the examination.  If the procedure report does not answer your questions, please call your gastroenterologist to clarify.  If you requested that your care partner not be given the details of your procedure findings, then the procedure report has been included in a sealed envelope for you to review at your convenience later.  YOU SHOULD EXPECT: Some feelings of bloating in the abdomen. Passage of more gas than usual.  Walking can help get rid of the air that was put into your GI tract during the procedure and reduce the bloating. If you had a lower endoscopy (such as a colonoscopy or flexible sigmoidoscopy) you may notice spotting of blood in your stool or on the toilet paper. If you underwent a bowel prep for your procedure, you may not have a normal bowel movement for a few days.  Please Note:  You might notice some irritation and congestion in your nose or some drainage.  This is from the oxygen used during your procedure.  There is no need for concern and it should clear up in a day or so.  SYMPTOMS TO REPORT IMMEDIATELY:  Following lower endoscopy (colonoscopy or flexible sigmoidoscopy):  Excessive amounts of blood in the stool  Significant tenderness or worsening of abdominal pains  Swelling of the abdomen that is new, acute  Fever of 100F or higher   For urgent or emergent issues, a gastroenterologist can be reached at any hour by calling [phone]. Do not use MyChart messaging for urgent concerns.    DIET:  We do recommend a small meal at first, but then you may proceed to your regular diet.  Drink plenty of fluids but you should avoid alcoholic beverages for 24 hours.  ACTIVITY:  You should plan to take it easy for the rest of today and you should  NOT DRIVE or use heavy machinery until tomorrow (because of the sedation medicines used during the test).    FOLLOW UP: Our staff will call the number listed on your records the next business day following your procedure.  We will call around 7:15- 8:00 am to check on you and address any questions or concerns that you may have regarding the information given to you following your procedure. If we do not reach you, we will leave a message.     If any biopsies were taken you will be contacted by phone or by letter within the next 1-3 weeks.  Please call us at [phone] if you have not heard about the biopsies in 3 weeks.    SIGNATURES/CONFIDENTIALITY: You and/or your care partner have signed paperwork which will be entered into your electronic medical record.  These signatures attest to the fact that that the information above on your After Visit Summary has been reviewed and is understood.  Full responsibility of the confidentiality of this discharge information lies with you and/or your care-partner.

## 2023-03-21 NOTE — Progress Notes (Signed)
Report to PACU, RN, vss, BBS= Clear.  

## 2023-03-22 ENCOUNTER — Telehealth: Payer: Self-pay | Admitting: *Deleted

## 2023-03-22 NOTE — Telephone Encounter (Signed)
  Follow up Call-     03/21/2023    1:03 PM 03/21/2023    1:02 PM  Call back number  Post procedure Call Back phone  # [phone]   Permission to leave phone message  Yes    Post procedure follow up phone call. No answer at number given.  Left message on voicemail.

## 2023-03-30 ENCOUNTER — Ambulatory Visit: Payer: 59

## 2023-04-02 ENCOUNTER — Ambulatory Visit (INDEPENDENT_AMBULATORY_CARE_PROVIDER_SITE_OTHER): Payer: 59

## 2023-04-02 DIAGNOSIS — Z23 Encounter for immunization: Secondary | ICD-10-CM | POA: Diagnosis not present

## 2023-04-11 ENCOUNTER — Other Ambulatory Visit: Payer: Self-pay | Admitting: Internal Medicine

## 2023-04-11 DIAGNOSIS — E1169 Type 2 diabetes mellitus with other specified complication: Secondary | ICD-10-CM

## 2023-04-12 ENCOUNTER — Other Ambulatory Visit: Payer: Self-pay | Admitting: Internal Medicine

## 2023-04-12 DIAGNOSIS — E1169 Type 2 diabetes mellitus with other specified complication: Secondary | ICD-10-CM

## 2023-06-11 ENCOUNTER — Telehealth: Payer: Self-pay | Admitting: Internal Medicine

## 2023-06-11 NOTE — Telephone Encounter (Signed)
 Copied from CRM [id number]. Topic: Clinical - Medication Question >> Jun 11, 2023 11:13 AM Drema Balzarine wrote: Reason for CRM: Patient would like a call back to discuss her medications

## 2023-06-11 NOTE — Telephone Encounter (Signed)
 Patient states that she already takes them separately.

## 2023-06-11 NOTE — Telephone Encounter (Signed)
 Spoke to patient and she explained that she take Metformin  and Atorvastatin  the same time every day, and every day she feels bad with GI upset and body aches.  She stopped the medication this weekend and is feeling much better.  She did take the medication today.  She has her physical scheduled for 07/23/23.  Please advise.

## 2023-06-26 ENCOUNTER — Encounter: Payer: 59 | Admitting: Internal Medicine

## 2023-07-23 ENCOUNTER — Encounter: Payer: 59 | Admitting: Internal Medicine

## 2023-08-14 ENCOUNTER — Encounter: Payer: Self-pay | Admitting: Internal Medicine

## 2023-08-14 ENCOUNTER — Ambulatory Visit (INDEPENDENT_AMBULATORY_CARE_PROVIDER_SITE_OTHER): Payer: 59 | Admitting: Internal Medicine

## 2023-08-14 ENCOUNTER — Other Ambulatory Visit: Payer: Self-pay | Admitting: Internal Medicine

## 2023-08-14 VITALS — BP 110/70 | HR 70 | Temp 98.2°F | Ht 65.0 in | Wt 156.8 lb

## 2023-08-14 DIAGNOSIS — Z23 Encounter for immunization: Secondary | ICD-10-CM | POA: Diagnosis not present

## 2023-08-14 DIAGNOSIS — E538 Deficiency of other specified B group vitamins: Secondary | ICD-10-CM

## 2023-08-14 DIAGNOSIS — E1169 Type 2 diabetes mellitus with other specified complication: Secondary | ICD-10-CM | POA: Diagnosis not present

## 2023-08-14 DIAGNOSIS — Z0100 Encounter for examination of eyes and vision without abnormal findings: Secondary | ICD-10-CM

## 2023-08-14 DIAGNOSIS — E559 Vitamin D deficiency, unspecified: Secondary | ICD-10-CM

## 2023-08-14 DIAGNOSIS — Z780 Asymptomatic menopausal state: Secondary | ICD-10-CM

## 2023-08-14 DIAGNOSIS — E785 Hyperlipidemia, unspecified: Secondary | ICD-10-CM

## 2023-08-14 DIAGNOSIS — Z0000 Encounter for general adult medical examination without abnormal findings: Secondary | ICD-10-CM | POA: Diagnosis not present

## 2023-08-14 DIAGNOSIS — Z7985 Long-term (current) use of injectable non-insulin antidiabetic drugs: Secondary | ICD-10-CM

## 2023-08-14 DIAGNOSIS — E7800 Pure hypercholesterolemia, unspecified: Secondary | ICD-10-CM

## 2023-08-14 DIAGNOSIS — Z7984 Long term (current) use of oral hypoglycemic drugs: Secondary | ICD-10-CM

## 2023-08-14 DIAGNOSIS — Z1231 Encounter for screening mammogram for malignant neoplasm of breast: Secondary | ICD-10-CM

## 2023-08-14 DIAGNOSIS — E119 Type 2 diabetes mellitus without complications: Secondary | ICD-10-CM

## 2023-08-14 LAB — CBC WITH DIFFERENTIAL/PLATELET
Basophils Absolute: 0 10*3/uL (ref 0.0–0.1)
Basophils Relative: 0.4 % (ref 0.0–3.0)
Eosinophils Absolute: 0.1 10*3/uL (ref 0.0–0.7)
Eosinophils Relative: 1.5 % (ref 0.0–5.0)
HCT: 39.9 % (ref 36.0–46.0)
Hemoglobin: 13.1 g/dL (ref 12.0–15.0)
Lymphocytes Relative: 34.8 % (ref 12.0–46.0)
Lymphs Abs: 1.6 10*3/uL (ref 0.7–4.0)
MCHC: 32.9 g/dL (ref 30.0–36.0)
MCV: 81.7 fl (ref 78.0–100.0)
Monocytes Absolute: 0.2 10*3/uL (ref 0.1–1.0)
Monocytes Relative: 4.8 % (ref 3.0–12.0)
Neutro Abs: 2.7 10*3/uL (ref 1.4–7.7)
Neutrophils Relative %: 58.5 % (ref 43.0–77.0)
Platelets: 238 10*3/uL (ref 150.0–400.0)
RBC: 4.88 Mil/uL (ref 3.87–5.11)
RDW: 15.1 % (ref 11.5–15.5)
WBC: 4.5 10*3/uL (ref 4.0–10.5)

## 2023-08-14 LAB — COMPREHENSIVE METABOLIC PANEL
ALT: 12 U/L (ref 0–35)
AST: 17 U/L (ref 0–37)
Albumin: 4.6 g/dL (ref 3.5–5.2)
Alkaline Phosphatase: 87 U/L (ref 39–117)
BUN: 9 mg/dL (ref 6–23)
CO2: 28 meq/L (ref 19–32)
Calcium: 9.8 mg/dL (ref 8.4–10.5)
Chloride: 105 meq/L (ref 96–112)
Creatinine, Ser: 0.72 mg/dL (ref 0.40–1.20)
GFR: 87.77 mL/min (ref >60.00)
Glucose, Bld: 108 mg/dL — ABNORMAL HIGH (ref 70–99)
Potassium: 4.1 meq/L (ref 3.5–5.1)
Sodium: 142 meq/L (ref 135–145)
Total Bilirubin: 0.6 mg/dL (ref 0.2–1.2)
Total Protein: 7.5 g/dL (ref 6.0–8.3)

## 2023-08-14 LAB — VITAMIN D 25 HYDROXY (VIT D DEFICIENCY, FRACTURES): VITD: 22.7 ng/mL — ABNORMAL LOW (ref 30.00–100.00)

## 2023-08-14 LAB — LIPID PANEL
Cholesterol: 203 mg/dL — ABNORMAL HIGH (ref 0–200)
HDL: 86.5 mg/dL (ref >39.00)
LDL Cholesterol: 102 mg/dL — ABNORMAL HIGH (ref 0–99)
NonHDL: 116.92
Total CHOL/HDL Ratio: 2
Triglycerides: 76 mg/dL (ref 0.0–149.0)
VLDL: 15.2 mg/dL (ref 0.0–40.0)

## 2023-08-14 LAB — MICROALBUMIN / CREATININE URINE RATIO
Creatinine,U: 94.9 mg/dL
Microalb Creat Ratio: UNDETERMINED mg/g (ref 0.0–30.0)
Microalb, Ur: <0.7 mg/dL

## 2023-08-14 LAB — HEMOGLOBIN A1C: Hgb A1c MFr Bld: 6.9 % — ABNORMAL HIGH (ref 4.6–6.5)

## 2023-08-14 LAB — VITAMIN B12: Vitamin B-12: 673 pg/mL (ref 211–911)

## 2023-08-14 MED ORDER — OZEMPIC (0.25 OR 0.5 MG/DOSE) 2 MG/3ML ~~LOC~~ SOPN
0.5000 mg | PEN_INJECTOR | SUBCUTANEOUS | 2 refills | Status: DC
Start: 2023-08-14 — End: 2023-11-19

## 2023-08-14 MED ORDER — VITAMIN D (ERGOCALCIFEROL) 1.25 MG (50000 UNIT) PO CAPS: 50000.0000 [IU] | ORAL_CAPSULE | ORAL | 0 refills | Status: AC

## 2023-08-14 NOTE — Progress Notes (Signed)
 Established Patient Office Visit     CC/Reason for Visit: Annual preventive exam  HPI: Yvonne Marshall is a 68 y.o. female who is coming in today for the above mentioned reasons. Past Medical History is significant for: Type 2 diabetes, hyperlipidemia, vitamin D and B12 deficiency.  She is feeling well.  She is no longer following with endocrinologist.  She is overdue for an eye exam, mammogram and bone density.  She follows routinely with GYN.  She is due for RSV and pneumonia vaccines.   Past Medical/Surgical History: Past Medical History:  Diagnosis Date   Allergic rhinitis    Anemia, mild    Anxiety    Asthma, mild    Chest pain, atypical    Degenerative joint disease    Diabetes mellitus without complication (HCC)    GERD (gastroesophageal reflux disease)    Hemorrhoids    Hyperlipidemia    IBS (irritable bowel syndrome)     Past Surgical History:  Procedure Laterality Date   FINGER SURGERY     HYSTEROSCOPY  1998   PELVIC LAPAROSCOPY     cystectomy, 1990s   ROTATOR CUFF REPAIR Left 1990    Social History:  reports that she has never smoked. She has never used smokeless tobacco. She reports current alcohol use of about 3.0 - 4.0 standard drinks of alcohol per week. She reports that she does not use drugs.  Allergies: No Known Allergies  Family History:  Family History  Problem Relation Age of Onset   Heart failure Mother    Diabetes Mother    Alcohol abuse Father    Lymphoma Sister    Lung cancer Brother    Diabetes Brother    Diabetes Brother    Breast cancer Maternal Grandmother 73 - 69   Colon cancer Neg Hx    Esophageal cancer Neg Hx    Rectal cancer Neg Hx    Stomach cancer Neg Hx    Ovarian cancer Neg Hx      Current Outpatient Medications:    atorvastatin (LIPITOR) 40 MG tablet, TAKE 1 TABLET BY MOUTH EVERY DAY, Disp: 90 tablet, Rfl: 1   Cholecalciferol (VITAMIN D) 2000 UNITS tablet, Take 1 tablet (2,000 Units total) by mouth daily.,  Disp: 90 tablet, Rfl: 1   Cyanocobalamin (VITAMIN B-12 PO), Take 1 tablet by mouth daily., Disp: , Rfl:    metFORMIN (GLUCOPHAGE) 500 MG tablet, TAKE 1 TABLET BY MOUTH 2 TIMES DAILY WITH A MEAL. (Patient taking differently: Take 500 mg by mouth daily with breakfast.), Disp: 180 tablet, Rfl: 1  Review of Systems:  Negative unless indicated in HPI.   Physical Exam: Vitals:   08/14/23 0805  BP: 110/70  Pulse: 70  Temp: 98.2 F (36.8 C)  TempSrc: Oral  SpO2: 98%  Weight: 156 lb 12.8 oz (71.1 kg)  Height: 5\' 5"  (1.651 m)    Body mass index is 26.09 kg/m.   Physical Exam Vitals reviewed.  Constitutional:      General: She is not in acute distress.    Appearance: Normal appearance. She is not ill-appearing, toxic-appearing or diaphoretic.  HENT:     Head: Normocephalic.     Right Ear: Tympanic membrane, ear canal and external ear normal. There is no impacted cerumen.     Left Ear: Tympanic membrane, ear canal and external ear normal. There is no impacted cerumen.     Nose: Nose normal.     Mouth/Throat:     Mouth: Mucous  membranes are moist.     Pharynx: Oropharynx is clear. No oropharyngeal exudate or posterior oropharyngeal erythema.  Eyes:     General: No scleral icterus.       Right eye: No discharge.        Left eye: No discharge.     Conjunctiva/sclera: Conjunctivae normal.     Pupils: Pupils are equal, round, and reactive to light.  Neck:     Vascular: No carotid bruit.  Cardiovascular:     Rate and Rhythm: Normal rate and regular rhythm.     Pulses: Normal pulses.     Heart sounds: Normal heart sounds.  Pulmonary:     Effort: Pulmonary effort is normal. No respiratory distress.     Breath sounds: Normal breath sounds.  Abdominal:     General: Abdomen is flat. Bowel sounds are normal.     Palpations: Abdomen is soft.  Musculoskeletal:        General: Normal range of motion.     Cervical back: Normal range of motion.  Skin:    General: Skin is warm and dry.   Neurological:     General: No focal deficit present.     Mental Status: She is alert and oriented to person, place, and time. Mental status is at baseline.  Psychiatric:        Mood and Affect: Mood normal.        Behavior: Behavior normal.        Thought Content: Thought content normal.        Judgment: Judgment normal.      Impression and Plan:  Vitamin B12 deficiency  Hyperlipidemia associated with type 2 diabetes mellitus (HCC) -     Lipid panel; Future  Type 2 diabetes mellitus with other specified complication, without long-term current use of insulin (HCC) -     Hemoglobin A1c; Future -     CBC with Differential/Platelet; Future -     Comprehensive metabolic panel; Future -     Microalbumin / creatinine urine ratio; Future  B12 deficiency -     Vitamin B12; Future  Type 2 diabetes mellitus without complication, without long-term current use of insulin (HCC)  Vitamin D deficiency -     VITAMIN D 25 Hydroxy (Vit-D Deficiency, Fractures); Future  Encounter for preventive health examination  Hypercholesterolemia  Immunization due  Postmenopausal estrogen deficiency  Screening mammogram for breast cancer  Encounter for vision screening   -Recommend routine eye and dental care. -Healthy lifestyle discussed in detail. -Labs to be updated today. -Prostate cancer screening: N/A Health Maintenance  Topic Date Due   Pneumonia Vaccine (1 of 2 - PCV) Never done   HIV Screening  Never done   COVID-19 Vaccine (4 - 2024-25 season) 01/28/2023   DEXA scan (bone density measurement)  Never done   Hemoglobin A1C  05/26/2023   Eye exam for diabetics  07/06/2023   Yearly kidney function blood test for diabetes  08/01/2023   Yearly kidney health urinalysis for diabetes  08/01/2023   Mammogram  04/03/2024   Complete foot exam   08/13/2024   Pap with HPV screening  03/12/2028   DTaP/Tdap/Td vaccine (3 - Td or Tdap) 09/18/2030   Colon Cancer Screening  03/20/2033   Flu  Shot  Completed   Hepatitis C Screening  Completed   Zoster (Shingles) Vaccine  Completed   HPV Vaccine  Aged Out     -PCV 20 administered in office today. -Schedule ophthalmology, mammogram, bone density.  Chaya Jan, MD [facility] at [facility]

## 2023-08-16 ENCOUNTER — Telehealth: Payer: Self-pay | Admitting: Internal Medicine

## 2023-08-16 NOTE — Telephone Encounter (Signed)
 Pt is aware md out of office and will be back on Monday and pt did not mention pain under left armpit and now need diagnostic mammogram and ultrasound.

## 2023-08-16 NOTE — Telephone Encounter (Signed)
 Can you order the diagnostic mammogram because Dr Ardyth Harps is not in the office ?  thanks

## 2023-08-20 ENCOUNTER — Telehealth: Payer: Self-pay | Admitting: Internal Medicine

## 2023-08-20 ENCOUNTER — Encounter: Payer: Self-pay | Admitting: *Deleted

## 2023-08-20 DIAGNOSIS — E1169 Type 2 diabetes mellitus with other specified complication: Secondary | ICD-10-CM

## 2023-08-20 NOTE — Telephone Encounter (Signed)
 Copied from CRM [id number]. Topic: General - Call Back - No Documentation >> Aug 20, 2023  1:31 PM Yvonne Marshall wrote: Reason for CRM: Patient called returning a call back. Provided notes to schedule appointment stated she would like to speak with someone in the office.

## 2023-08-20 NOTE — Telephone Encounter (Signed)
 Talked w/ pt. Pt is very upset and confused on why she has to come in to see the Dr about her breast px and was wondering why Dr can't just take her word.   Informed pt that Dr. would have to do a breast exam and things such as that as well as have documentation that she came in to be seen by the Dr in order for orders to be placed for a diagnostic. Pt then began to say that she doesn't feel like we care about her and that nobody is returning her calls. Assured pt that we do care and they only message we got from her last week was on Tues. 3/18.  Pt stated she is upset with the call center and feel like nobody is telling the truth. Her past Dr before he retired was great and now she feels like she needs to see another Dr. Asked pt if she wanted to sched an appt or sched to see another Dr? Pt stated that this was urgent so she sched an appt instead.

## 2023-08-20 NOTE — Telephone Encounter (Signed)
Left detailed message on machine with Dr Hardie Shackleton recommendation.

## 2023-08-20 NOTE — Telephone Encounter (Signed)
 Spoke to the patient and she states that she has had left breast pain and the breast center told her that she needs a diagnostic mammogram.  She feels as though she should not have to come back to the office. Patient also has a GYN.  She also stated that her notes says that she has been taking B12 but that she might need to take something stronger.  She would like to know what to take?

## 2023-08-21 ENCOUNTER — Encounter: Payer: Self-pay | Admitting: Internal Medicine

## 2023-08-21 ENCOUNTER — Ambulatory Visit (INDEPENDENT_AMBULATORY_CARE_PROVIDER_SITE_OTHER): Admitting: Internal Medicine

## 2023-08-21 VITALS — BP 110/70 | HR 73

## 2023-08-21 DIAGNOSIS — N644 Mastodynia: Secondary | ICD-10-CM

## 2023-08-21 NOTE — Telephone Encounter (Signed)
 Will discuss at office visit 08/21/23.

## 2023-08-21 NOTE — Progress Notes (Signed)
 Established Patient Office Visit     CC/Reason for Visit: Left breast pain  HPI: Yvonne Marshall is a 68 y.o. female who is coming in today for the above mentioned reasons.  She was scheduled to have her screening mammogram and requested a left diagnostic due to left breast pain and was asked to follow-up with me.  Pain is in fact over the left lateral breast in the mid axillary line.  She has not noticed any nipple discharge or lumps.   Past Medical/Surgical History: Past Medical History:  Diagnosis Date   Allergic rhinitis    Anemia, mild    Anxiety    Asthma, mild    Chest pain, atypical    Degenerative joint disease    Diabetes mellitus without complication (HCC)    GERD (gastroesophageal reflux disease)    Hemorrhoids    Hyperlipidemia    IBS (irritable bowel syndrome)     Past Surgical History:  Procedure Laterality Date   FINGER SURGERY     HYSTEROSCOPY  1998   PELVIC LAPAROSCOPY     cystectomy, 1990s   ROTATOR CUFF REPAIR Left 1990    Social History:  reports that she has never smoked. She has never used smokeless tobacco. She reports current alcohol use of about 3.0 - 4.0 standard drinks of alcohol per week. She reports that she does not use drugs.  Allergies: No Known Allergies  Family History:  Family History  Problem Relation Age of Onset   Heart failure Mother    Diabetes Mother    Alcohol abuse Father    Lymphoma Sister    Lung cancer Brother    Diabetes Brother    Diabetes Brother    Breast cancer Maternal Grandmother 66 - 69   Colon cancer Neg Hx    Esophageal cancer Neg Hx    Rectal cancer Neg Hx    Stomach cancer Neg Hx    Ovarian cancer Neg Hx      Current Outpatient Medications:    atorvastatin (LIPITOR) 40 MG tablet, TAKE 1 TABLET BY MOUTH EVERY DAY, Disp: 90 tablet, Rfl: 1   Cholecalciferol (VITAMIN D) 2000 UNITS tablet, Take 1 tablet (2,000 Units total) by mouth daily., Disp: 90 tablet, Rfl: 1   Cyanocobalamin (VITAMIN B-12  PO), Take 1 tablet by mouth daily., Disp: , Rfl:    metFORMIN (GLUCOPHAGE) 500 MG tablet, TAKE 1 TABLET BY MOUTH 2 TIMES DAILY WITH A MEAL. (Patient taking differently: Take 500 mg by mouth daily with breakfast.), Disp: 180 tablet, Rfl: 1   Semaglutide,0.25 or 0.5MG /DOS, (OZEMPIC, 0.25 OR 0.5 MG/DOSE,) 2 MG/3ML SOPN, Inject 0.5 mg into the skin once a week., Disp: 3 mL, Rfl: 2   Vitamin D, Ergocalciferol, (DRISDOL) 1.25 MG (50000 UNIT) CAPS capsule, Take 1 capsule (50,000 Units total) by mouth every 7 (seven) days for 12 doses., Disp: 12 capsule, Rfl: 0  Review of Systems:  Negative unless indicated in HPI.   Physical Exam: Vitals:   08/21/23 1502  BP: 110/70  Pulse: 73  SpO2: 98%    There is no height or weight on file to calculate BMI.   Physical Exam Chest:     Chest wall: No mass.  Breasts:    Right: Normal.     Left: Normal.      Impression and Plan:  Breast pain, left -     MM Digital Diagnostic Unilat L; Future -     US BREAST COMPLETE UNI LEFT INC  AXILLA; Future  -No lumps identified, okay to proceed with diagnostic imaging.   Time spent:21 minutes reviewing chart, interviewing and examining patient and formulating plan of care.     Chaya Jan, MD [facility]

## 2023-09-04 MED ORDER — ATORVASTATIN CALCIUM 80 MG PO TABS: 80.0000 mg | ORAL_TABLET | Freq: Every day | ORAL | 1 refills | Status: DC

## 2023-09-04 NOTE — Telephone Encounter (Signed)
 Patient is aware. Rx sent.  Follow up appointment scheduled and lab ordered.

## 2023-09-04 NOTE — Telephone Encounter (Signed)
 Patient is aware of lab results.  A follow up office visit has been scheduled.  Patient is taking atorvastatin 40 mg.

## 2023-09-17 ENCOUNTER — Other Ambulatory Visit

## 2023-09-17 ENCOUNTER — Encounter

## 2023-09-28 ENCOUNTER — Ambulatory Visit

## 2023-09-28 ENCOUNTER — Ambulatory Visit
Admission: RE | Admit: 2023-09-28 | Discharge: 2023-09-28 | Disposition: A | Discharge status: Home / Self Care | Source: Ambulatory Visit | Attending: Internal Medicine | Admitting: Internal Medicine

## 2023-09-28 DIAGNOSIS — N644 Mastodynia: Secondary | ICD-10-CM

## 2023-11-01 ENCOUNTER — Other Ambulatory Visit: Payer: Self-pay | Admitting: Internal Medicine

## 2023-11-01 DIAGNOSIS — E559 Vitamin D deficiency, unspecified: Secondary | ICD-10-CM

## 2023-11-16 ENCOUNTER — Other Ambulatory Visit: Payer: Self-pay | Admitting: Internal Medicine

## 2023-11-16 DIAGNOSIS — E1169 Type 2 diabetes mellitus with other specified complication: Secondary | ICD-10-CM

## 2023-12-06 ENCOUNTER — Ambulatory Visit: Admitting: Internal Medicine

## 2023-12-06 ENCOUNTER — Encounter: Payer: Self-pay | Admitting: Internal Medicine

## 2023-12-06 VITALS — BP 118/68 | HR 70 | Temp 97.8°F | Ht 65.0 in | Wt 138.7 lb

## 2023-12-06 DIAGNOSIS — E785 Hyperlipidemia, unspecified: Secondary | ICD-10-CM | POA: Diagnosis not present

## 2023-12-06 DIAGNOSIS — Z7984 Long term (current) use of oral hypoglycemic drugs: Secondary | ICD-10-CM | POA: Diagnosis not present

## 2023-12-06 DIAGNOSIS — E1169 Type 2 diabetes mellitus with other specified complication: Secondary | ICD-10-CM | POA: Diagnosis not present

## 2023-12-06 DIAGNOSIS — E559 Vitamin D deficiency, unspecified: Secondary | ICD-10-CM | POA: Diagnosis not present

## 2023-12-06 DIAGNOSIS — Z7985 Long-term (current) use of injectable non-insulin antidiabetic drugs: Secondary | ICD-10-CM

## 2023-12-06 LAB — LIPID PANEL
Cholesterol: 175 mg/dL (ref 0–200)
HDL: 75.1 mg/dL (ref >39.00)
LDL Cholesterol: 87 mg/dL (ref 0–99)
NonHDL: 100.13
Total CHOL/HDL Ratio: 2
Triglycerides: 64 mg/dL (ref 0.0–149.0)
VLDL: 12.8 mg/dL (ref 0.0–40.0)

## 2023-12-06 LAB — POCT GLYCOSYLATED HEMOGLOBIN (HGB A1C): Hemoglobin A1C: 5.6 % (ref 4.0–5.6)

## 2023-12-06 LAB — VITAMIN D 25 HYDROXY (VIT D DEFICIENCY, FRACTURES): VITD: 42.89 ng/mL (ref 30.00–100.00)

## 2023-12-06 NOTE — Progress Notes (Signed)
 Established Patient Office Visit     CC/Reason for Visit: 10-month follow-up chronic medical conditions  HPI: Yvonne Marshall is a 68 y.o. female who is coming in today for the above mentioned reasons. Past Medical History is significant for: Type 2 diabetes, hyperlipidemia, vitamin D  and B12 deficiencies.  At her last visit we increased her atorvastatin  to 80 mg and put her on high-dose vitamin D  supplementation.  She is here today to follow-up.  She was also started on Ozempic  due to her rising A1c.   Past Medical/Surgical History: Past Medical History:  Diagnosis Date   Allergic rhinitis    Anemia, mild    Anxiety    Asthma, mild    Chest pain, atypical    Degenerative joint disease    Diabetes mellitus without complication (HCC)    GERD (gastroesophageal reflux disease)    Hemorrhoids    Hyperlipidemia    IBS (irritable bowel syndrome)     Past Surgical History:  Procedure Laterality Date   FINGER SURGERY     HYSTEROSCOPY  1998   PELVIC LAPAROSCOPY     cystectomy, 1990s   ROTATOR CUFF REPAIR Left 1990    Social History:  reports that she has never smoked. She has never used smokeless tobacco. She reports current alcohol use of about 3.0 - 4.0 standard drinks of alcohol per week. She reports that she does not use drugs.  Allergies: No Known Allergies  Family History:  Family History  Problem Relation Age of Onset   Heart failure Mother    Diabetes Mother    Alcohol abuse Father    Lymphoma Sister    Lung cancer Brother    Diabetes Brother    Diabetes Brother    Breast cancer Maternal Grandmother 11 - 69   Colon cancer Neg Hx    Esophageal cancer Neg Hx    Rectal cancer Neg Hx    Stomach cancer Neg Hx    Ovarian cancer Neg Hx      Current Outpatient Medications:    atorvastatin  (LIPITOR) 80 MG tablet, Take 1 tablet (80 mg total) by mouth daily., Disp: 90 tablet, Rfl: 1   Cholecalciferol (VITAMIN D ) 2000 UNITS tablet, Take 1 tablet (2,000 Units  total) by mouth daily., Disp: 90 tablet, Rfl: 1   Cyanocobalamin  (VITAMIN B-12 PO), Take 1 tablet by mouth daily., Disp: , Rfl:    Semaglutide ,0.25 or 0.5MG /DOS, (OZEMPIC , 0.25 OR 0.5 MG/DOSE,) 2 MG/3ML SOPN, INJECT 0.5 MG INTO THE SKIN ONE TIME PER WEEK, Disp: 2 mL, Rfl: 2   metFORMIN  (GLUCOPHAGE ) 500 MG tablet, TAKE 1 TABLET BY MOUTH 2 TIMES DAILY WITH A MEAL. (Patient not taking: Reported on 12/06/2023), Disp: 180 tablet, Rfl: 1  Review of Systems:  Negative unless indicated in HPI.   Physical Exam: Vitals:   12/06/23 0702  BP: 118/68  Pulse: 70  Temp: 97.8 F (36.6 C)  SpO2: 99%  Weight: 138 lb 11.2 oz (62.9 kg)  Height: 5' 5 (1.651 m)    Body mass index is 23.08 kg/m.   Physical Exam Vitals reviewed.  Constitutional:      Appearance: Normal appearance.  HENT:     Head: Normocephalic and atraumatic.  Eyes:     Conjunctiva/sclera: Conjunctivae normal.  Cardiovascular:     Rate and Rhythm: Normal rate and regular rhythm.  Pulmonary:     Effort: Pulmonary effort is normal.     Breath sounds: Normal breath sounds.  Skin:  General: Skin is warm and dry.  Neurological:     General: No focal deficit present.     Mental Status: She is alert and oriented to person, place, and time.  Psychiatric:        Mood and Affect: Mood normal.        Behavior: Behavior normal.        Thought Content: Thought content normal.        Judgment: Judgment normal.      Impression and Plan:  Type 2 diabetes mellitus with other specified complication, without long-term current use of insulin (HCC) -     POCT glycosylated hemoglobin (Hb A1C)  Hyperlipidemia associated with type 2 diabetes mellitus (HCC)  Vitamin D  deficiency   - A1c of 5.6 demonstrates excellent diabetic management. - Congratulated on weight loss success. - Check vitamin D  and lipids today.  Time spent:32 minutes reviewing chart, interviewing and examining patient and formulating plan of care.     Tully Theophilus Andrews, MD [facility]

## 2023-12-10 ENCOUNTER — Ambulatory Visit: Payer: Self-pay | Admitting: Internal Medicine

## 2023-12-11 MED ORDER — EZETIMIBE 10 MG PO TABS: 10.0000 mg | ORAL_TABLET | Freq: Every day | ORAL | 1 refills | Status: AC

## 2024-01-12 ENCOUNTER — Other Ambulatory Visit: Payer: Self-pay | Admitting: Internal Medicine

## 2024-01-12 DIAGNOSIS — E1169 Type 2 diabetes mellitus with other specified complication: Secondary | ICD-10-CM

## 2024-03-10 ENCOUNTER — Ambulatory Visit: Admitting: Internal Medicine

## 2024-03-12 ENCOUNTER — Ambulatory Visit: Admitting: Internal Medicine

## 2024-03-12 ENCOUNTER — Other Ambulatory Visit: Payer: Self-pay | Admitting: Internal Medicine

## 2024-03-12 DIAGNOSIS — E1169 Type 2 diabetes mellitus with other specified complication: Secondary | ICD-10-CM

## 2024-03-17 ENCOUNTER — Telehealth: Payer: Self-pay | Admitting: *Deleted

## 2024-03-17 NOTE — Telephone Encounter (Signed)
 Copied from CRM #[id number]. Topic: Clinical - Medication Question >> Mar 12, 2024  4:27 PM Dedra B wrote: Reason for CRM: Pt called to follow up on Ozempic  refill request sent by pharmacy. Informed pt that refill request typically take up to 3 business days. Pt would like to have it today if possible since she takes her shot on Wednesdays.

## 2024-03-17 NOTE — Telephone Encounter (Signed)
 Refill was sent

## 2024-03-17 NOTE — Telephone Encounter (Signed)
 Copied from CRM [id number]. Topic: Clinical - Prescription Issue >> Mar 13, 2024 12:11 PM Paige D wrote: Reason for CRM: Pt calling in regards to  Semaglutide ,0.25 or 0.5MG /DOS, (OZEMPIC , 0.25 OR 0.5 MG/DOSE,) 2 MG/3ML SOPN states pharmacy just called her and said it was denied pt is asking why this medication is denied. Pt had an appt for lab but is having car issues and wont have her car back until the 27th of this month. Please reach out to pt as she is needing her medication.

## 2024-04-01 ENCOUNTER — Telehealth: Payer: Self-pay | Admitting: Internal Medicine

## 2024-04-01 NOTE — Telephone Encounter (Signed)
 Copied from CRM [id number]. Topic: Appointments - Scheduling Inquiry for Clinic >> Apr 01, 2024  8:45 AM Mesmerise C wrote: Reason for CRM: Patient calling needing to reschedule an appointment but stated she does not want to speak with contact center would like to speak with clinic directly, called [facility] and spoke to [place] she stated will not speak with her and a message was sent yesterday to the patient that she would have to speak with us  I asked if she could reschedule her due to patient not comfortable with speaking to me stated no because we can do the same things they do, told patient I would send a message and asked if I could be able to assist in rescheduling her appt pt expressed wanting to ask questions to the nurse asked if she was experiencing any symptoms stated no but she had additional questions regarding her appointment about getting her medications refilled

## 2024-04-01 NOTE — Telephone Encounter (Signed)
 Spoke to the patient and scheduled an appointment

## 2024-04-03 ENCOUNTER — Ambulatory Visit (INDEPENDENT_AMBULATORY_CARE_PROVIDER_SITE_OTHER): Admitting: Internal Medicine

## 2024-04-03 ENCOUNTER — Encounter: Payer: Self-pay | Admitting: Internal Medicine

## 2024-04-03 ENCOUNTER — Ambulatory Visit: Payer: Self-pay | Admitting: Internal Medicine

## 2024-04-03 VITALS — BP 110/70 | HR 65 | Temp 97.6°F | Wt 136.1 lb

## 2024-04-03 DIAGNOSIS — E1169 Type 2 diabetes mellitus with other specified complication: Secondary | ICD-10-CM | POA: Diagnosis not present

## 2024-04-03 DIAGNOSIS — Z7985 Long-term (current) use of injectable non-insulin antidiabetic drugs: Secondary | ICD-10-CM | POA: Diagnosis not present

## 2024-04-03 DIAGNOSIS — E785 Hyperlipidemia, unspecified: Secondary | ICD-10-CM | POA: Diagnosis not present

## 2024-04-03 DIAGNOSIS — Z23 Encounter for immunization: Secondary | ICD-10-CM

## 2024-04-03 LAB — COMPREHENSIVE METABOLIC PANEL WITH GFR
ALT: 19 U/L (ref 0–35)
AST: 22 U/L (ref 0–37)
Albumin: 4.2 g/dL (ref 3.5–5.2)
Alkaline Phosphatase: 72 U/L (ref 39–117)
BUN: 11 mg/dL (ref 6–23)
CO2: 29 meq/L (ref 19–32)
Calcium: 9.2 mg/dL (ref 8.4–10.5)
Chloride: 106 meq/L (ref 96–112)
Creatinine, Ser: 0.68 mg/dL (ref 0.40–1.20)
GFR: 91.02 mL/min (ref >60.00)
Glucose, Bld: 73 mg/dL (ref 70–99)
Potassium: 4.2 meq/L (ref 3.5–5.1)
Sodium: 143 meq/L (ref 135–145)
Total Bilirubin: 0.7 mg/dL (ref 0.2–1.2)
Total Protein: 6.5 g/dL (ref 6.0–8.3)

## 2024-04-03 LAB — POCT GLYCOSYLATED HEMOGLOBIN (HGB A1C): Hemoglobin A1C: 6 % — AB (ref 4.0–5.6)

## 2024-04-03 LAB — LIPID PANEL
Cholesterol: 160 mg/dL (ref 0–200)
HDL: 77.7 mg/dL (ref >39.00)
LDL Cholesterol: 73 mg/dL (ref 0–99)
NonHDL: 82.21
Total CHOL/HDL Ratio: 2
Triglycerides: 48 mg/dL (ref 0.0–149.0)
VLDL: 9.6 mg/dL (ref 0.0–40.0)

## 2024-04-03 NOTE — Assessment & Plan Note (Signed)
 Check lipids today since starting ezetimibe  3 months ago in addition to maximal dose of atorvastatin  due to suboptimal LDL.

## 2024-04-03 NOTE — Assessment & Plan Note (Signed)
 Well-controlled with an A1c of 6.0.  Continue Ozempic  0.5 mg.

## 2024-04-03 NOTE — Addendum Note (Signed)
 Addended by: KATHRYNE MILLMAN B on: 04/03/2024 11:43 AM   Modules accepted: Orders

## 2024-04-03 NOTE — Progress Notes (Signed)
 Established Patient Office Visit     CC/Reason for Visit: Follow-up chronic conditions  HPI: Yvonne Marshall is a 68 y.o. female who is coming in today for the above mentioned reasons. Past Medical History is significant for: Type 2 diabetes, hyperlipidemia, vitamin D  and B12 deficiencies.  She has been feeling well without concerns or complaints.  She discontinued metformin  as she is now on Ozempic  0.5 mg.  Has been taking both atorvastatin  and ezetimibe .   Past Medical/Surgical History: Past Medical History:  Diagnosis Date   Allergic rhinitis    Anemia, mild    Anxiety    Asthma, mild    Chest pain, atypical    Degenerative joint disease    Diabetes mellitus without complication (HCC)    GERD (gastroesophageal reflux disease)    Hemorrhoids    Hyperlipidemia    IBS (irritable bowel syndrome)     Past Surgical History:  Procedure Laterality Date   FINGER SURGERY     HYSTEROSCOPY  1998   PELVIC LAPAROSCOPY     cystectomy, 1990s   ROTATOR CUFF REPAIR Left 1990    Social History:  reports that she has never smoked. She has never used smokeless tobacco. She reports current alcohol use of about 3.0 - 4.0 standard drinks of alcohol per week. She reports that she does not use drugs.  Allergies: No Known Allergies  Family History:  Family History  Problem Relation Age of Onset   Heart failure Mother    Diabetes Mother    Alcohol abuse Father    Lymphoma Sister    Lung cancer Brother    Diabetes Brother    Diabetes Brother    Breast cancer Maternal Grandmother 7 - 69   Colon cancer Neg Hx    Esophageal cancer Neg Hx    Rectal cancer Neg Hx    Stomach cancer Neg Hx    Ovarian cancer Neg Hx      Current Outpatient Medications:    atorvastatin  (LIPITOR) 80 MG tablet, Take 1 tablet (80 mg total) by mouth daily., Disp: 90 tablet, Rfl: 1   Cholecalciferol (VITAMIN D ) 2000 UNITS tablet, Take 1 tablet (2,000 Units total) by mouth daily., Disp: 90 tablet, Rfl:  1   Cyanocobalamin  (VITAMIN B-12 PO), Take 1 tablet by mouth daily., Disp: , Rfl:    ezetimibe  (ZETIA ) 10 MG tablet, Take 1 tablet (10 mg total) by mouth daily., Disp: 90 tablet, Rfl: 1   Semaglutide ,0.25 or 0.5MG /DOS, (OZEMPIC , 0.25 OR 0.5 MG/DOSE,) 2 MG/3ML SOPN, INJECT 0.5 MG INTO THE SKIN ONE TIME PER WEEK, Disp: 3 mL, Rfl: 1   metFORMIN  (GLUCOPHAGE ) 500 MG tablet, TAKE 1 TABLET BY MOUTH 2 TIMES DAILY WITH A MEAL. (Patient not taking: Reported on 04/03/2024), Disp: 180 tablet, Rfl: 1  Review of Systems:  Negative unless indicated in HPI.   Physical Exam: Vitals:   04/03/24 0900  BP: 110/70  Pulse: 65  Temp: 97.6 F (36.4 C)  TempSrc: Oral  SpO2: 98%  Weight: 136 lb 1.6 oz (61.7 kg)    Body mass index is 22.65 kg/m.   Physical Exam Vitals reviewed.  Constitutional:      Appearance: Normal appearance.  HENT:     Head: Normocephalic and atraumatic.  Eyes:     Conjunctiva/sclera: Conjunctivae normal.  Cardiovascular:     Rate and Rhythm: Normal rate and regular rhythm.  Pulmonary:     Effort: Pulmonary effort is normal.     Breath sounds: Normal  breath sounds.  Skin:    General: Skin is warm and dry.  Neurological:     General: No focal deficit present.     Mental Status: She is alert and oriented to person, place, and time.  Psychiatric:        Mood and Affect: Mood normal.        Behavior: Behavior normal.        Thought Content: Thought content normal.        Judgment: Judgment normal.      Impression and Plan:  Type 2 diabetes mellitus with other specified complication, without long-term current use of insulin (HCC) Assessment & Plan: Well-controlled with an A1c of 6.0.  Continue Ozempic  0.5 mg.  Orders: -     POCT glycosylated hemoglobin (Hb A1C) -     Comprehensive metabolic panel with GFR; Future  Hyperlipidemia associated with type 2 diabetes mellitus (HCC) Assessment & Plan: Check lipids today since starting ezetimibe  3 months ago in addition to  maximal dose of atorvastatin  due to suboptimal LDL.  Orders: -     Lipid panel; Future  Immunization due  - Flu vaccine given in office today.   Time spent:31 minutes reviewing chart, interviewing and examining patient and formulating plan of care.     Tully Theophilus Andrews, MD [facility]

## 2024-04-08 ENCOUNTER — Other Ambulatory Visit: Payer: Self-pay | Admitting: Internal Medicine

## 2024-04-08 DIAGNOSIS — E1169 Type 2 diabetes mellitus with other specified complication: Secondary | ICD-10-CM

## 2024-04-08 NOTE — Telephone Encounter (Signed)
 Copied from CRM [id number]. Topic: Clinical - Medication Refill >> Apr 08, 2024  4:01 PM Viola F wrote: Medication: Semaglutide ,0.25 or 0.5MG /DOS, (OZEMPIC , 0.25 OR 0.5 MG/DOSE,) 2 MG/3ML SOPN [[id number]]  Has the patient contacted their pharmacy? Yes (Agent: If no, request that the patient contact the pharmacy for the refill. If patient does not wish to contact the pharmacy document the reason why and proceed with request.) (Agent: If yes, when and what did the pharmacy advise?)  This is the patient's preferred pharmacy:  [id number] [address] [facility] - [address] [place] [address] Phone: [phone] Fax: [phone]  Is this the correct pharmacy for this prescription? Yes If no, delete pharmacy and type the correct one.   Has the prescription been filled recently? Yes  Is the patient out of the medication? Yes  Has the patient been seen for an appointment in the last year OR does the patient have an upcoming appointment? Yes  Can we respond through MyChart? Yes  Agent: Please be advised that Rx refills may take up to 3 business days. We ask that you follow-up with your pharmacy.

## 2024-04-09 MED ORDER — OZEMPIC (0.25 OR 0.5 MG/DOSE) 2 MG/3ML ~~LOC~~ SOPN: 0.5000 mg | PEN_INJECTOR | SUBCUTANEOUS | 1 refills | Status: DC

## 2024-04-16 ENCOUNTER — Other Ambulatory Visit: Payer: Self-pay | Admitting: Internal Medicine

## 2024-04-16 ENCOUNTER — Telehealth: Payer: Self-pay | Admitting: *Deleted

## 2024-04-16 DIAGNOSIS — E1169 Type 2 diabetes mellitus with other specified complication: Secondary | ICD-10-CM

## 2024-04-16 NOTE — Telephone Encounter (Signed)
 Copied from CRM [id number]. Topic: Appointments - Scheduling Inquiry for Clinic >> Apr 16, 2024  9:40 AM Yvonne Marshall wrote: Reason for CRM: Patient req call back directly from clinic in regards to scheduling follow up appt, patient has questions as far as should she be fasting for appt.

## 2024-04-16 NOTE — Telephone Encounter (Unsigned)
 Copied from CRM [id number]. Topic: Clinical - Medication Refill >> Apr 16, 2024  9:38 AM Vanessa G wrote: Medication: Semaglutide ,0.25 or 0.5MG /DOS, (OZEMPIC , 0.25 OR 0.5 MG/DOSE,) 2 MG/3ML SOPN  Has the patient contacted their pharmacy? Yes, patient referred to provider (Agent: If no, request that the patient contact the pharmacy for the refill. If patient does not wish to contact the pharmacy document the reason why and proceed with request.) (Agent: If yes, when and what did the pharmacy advise?)  This is the patient's preferred pharmacy:  [id number] [address] [facility] - [address] [place] [address] Phone: [phone] Fax: [phone]  Is this the correct pharmacy for this prescription? Yes If no, delete pharmacy and type the correct one.   Has the prescription been filled recently? Yes  Is the patient out of the medication? No  Has the patient been seen for an appointment in the last year OR does the patient have an upcoming appointment? Yes  Can we respond through MyChart? Yes  Agent: Please be advised that Rx refills may take up to 3 business days. We ask that you follow-up with your pharmacy.

## 2024-04-16 NOTE — Telephone Encounter (Signed)
Left message on machine returning patient's call 

## 2024-04-17 MED ORDER — OZEMPIC (0.25 OR 0.5 MG/DOSE) 2 MG/3ML ~~LOC~~ SOPN: 0.5000 mg | PEN_INJECTOR | SUBCUTANEOUS | 1 refills | Status: AC

## 2024-04-17 NOTE — Telephone Encounter (Signed)
Attempted to call the patient, but the mailbox is full

## 2024-04-21 NOTE — Telephone Encounter (Signed)
 Spoke to the patient and a follow up appointment was scheduled.

## 2024-05-09 ENCOUNTER — Other Ambulatory Visit: Payer: Self-pay | Admitting: Internal Medicine

## 2024-07-07 ENCOUNTER — Ambulatory Visit: Admitting: Internal Medicine

## 2024-07-09 ENCOUNTER — Ambulatory Visit: Admitting: Internal Medicine
